# Patient Record
Sex: Female | Born: 1980
Health system: Southern US, Community
[De-identification: ages and names within clinical notes are randomized; demographics above are authoritative.]

## PROBLEM LIST (undated history)

## (undated) DIAGNOSIS — N390 Urinary tract infection, site not specified: Secondary | ICD-10-CM

## (undated) HISTORY — PX: APPENDECTOMY: SHX54

## (undated) HISTORY — PX: CHOLECYSTECTOMY: SHX55

## (undated) HISTORY — PX: ENDOMETRIAL ABLATION: SHX621

---

## 2005-01-18 ENCOUNTER — Emergency Department: Payer: Self-pay | Admitting: Emergency Medicine

## 2005-04-10 ENCOUNTER — Emergency Department: Payer: Self-pay | Admitting: Emergency Medicine

## 2006-08-24 ENCOUNTER — Emergency Department: Payer: Self-pay | Admitting: Emergency Medicine

## 2008-03-29 ENCOUNTER — Emergency Department: Payer: Self-pay | Admitting: Emergency Medicine

## 2008-10-03 ENCOUNTER — Emergency Department: Payer: Self-pay | Admitting: Internal Medicine

## 2009-09-12 ENCOUNTER — Observation Stay: Payer: Self-pay | Admitting: Psychiatry

## 2011-01-19 ENCOUNTER — Ambulatory Visit: Payer: Self-pay | Admitting: Unknown Physician Specialty

## 2011-01-23 ENCOUNTER — Ambulatory Visit: Payer: Self-pay | Admitting: Unknown Physician Specialty

## 2011-01-27 LAB — PATHOLOGY REPORT

## 2012-07-20 ENCOUNTER — Emergency Department: Payer: Self-pay | Admitting: Emergency Medicine

## 2013-03-09 ENCOUNTER — Emergency Department: Payer: Self-pay | Admitting: Emergency Medicine

## 2013-03-09 LAB — URINALYSIS, COMPLETE
Bilirubin,UR: NEGATIVE
Glucose,UR: NEGATIVE mg/dL (ref 0–75)
Ketone: NEGATIVE
Ph: 5 (ref 4.5–8.0)
Protein: 30
RBC,UR: 38 /HPF (ref 0–5)
Squamous Epithelial: 3
Transitional Epi: 1

## 2013-03-09 LAB — CBC
HCT: 42.7 % (ref 35.0–47.0)
HGB: 14.4 g/dL (ref 12.0–16.0)
MCV: 93 fL (ref 80–100)
Platelet: 347 10*3/uL (ref 150–440)

## 2013-03-09 LAB — COMPREHENSIVE METABOLIC PANEL
Albumin: 3.8 g/dL (ref 3.4–5.0)
Alkaline Phosphatase: 75 U/L (ref 50–136)
BUN: 14 mg/dL (ref 7–18)
Bilirubin,Total: 0.7 mg/dL (ref 0.2–1.0)
Co2: 26 mmol/L (ref 21–32)
Creatinine: 0.7 mg/dL (ref 0.60–1.30)
EGFR (Non-African Amer.): >60
Glucose: 83 mg/dL (ref 65–99)
Osmolality: 275 (ref 275–301)
Potassium: 4.1 mmol/L (ref 3.5–5.1)
SGOT(AST): 41 U/L — ABNORMAL HIGH (ref 15–37)
Sodium: 138 mmol/L (ref 136–145)

## 2013-05-26 ENCOUNTER — Emergency Department: Payer: Self-pay | Admitting: Emergency Medicine

## 2013-05-26 LAB — URINALYSIS, COMPLETE
Bilirubin,UR: NEGATIVE
Glucose,UR: NEGATIVE mg/dL (ref 0–75)
Nitrite: NEGATIVE
Ph: 5 (ref 4.5–8.0)
RBC,UR: 2375 /HPF (ref 0–5)
Specific Gravity: 1.028 (ref 1.003–1.030)
WBC UR: 5018 /HPF (ref 0–5)

## 2013-08-09 ENCOUNTER — Emergency Department: Payer: Self-pay | Admitting: Emergency Medicine

## 2013-08-09 LAB — URINALYSIS, COMPLETE
Specific Gravity: 1.021 (ref 1.003–1.030)
WBC UR: >30 /HPF (ref 0–5)

## 2013-08-11 LAB — URINE CULTURE

## 2013-10-09 ENCOUNTER — Emergency Department: Payer: Self-pay | Admitting: Emergency Medicine

## 2013-10-09 LAB — COMPREHENSIVE METABOLIC PANEL
Albumin: 3.5 g/dL (ref 3.4–5.0)
Alkaline Phosphatase: 76 U/L (ref 50–136)
Anion Gap: 10 (ref 7–16)
BUN: 16 mg/dL (ref 7–18)
Bilirubin,Total: 0.6 mg/dL (ref 0.2–1.0)
Chloride: 109 mmol/L — ABNORMAL HIGH (ref 98–107)
Co2: 19 mmol/L — ABNORMAL LOW (ref 21–32)
Creatinine: 0.86 mg/dL (ref 0.60–1.30)
EGFR (African American): >60
EGFR (Non-African Amer.): >60
Glucose: 125 mg/dL — ABNORMAL HIGH (ref 65–99)
Potassium: 3.9 mmol/L (ref 3.5–5.1)
SGOT(AST): 25 U/L (ref 15–37)
Sodium: 138 mmol/L (ref 136–145)
Total Protein: 8.2 g/dL (ref 6.4–8.2)

## 2013-10-09 LAB — CBC WITH DIFFERENTIAL/PLATELET
Basophil %: 1 %
Eosinophil #: 0.1 10*3/uL (ref 0.0–0.7)
Eosinophil %: 0.9 %
HCT: 43.3 % (ref 35.0–47.0)
HGB: 14.6 g/dL (ref 12.0–16.0)
Lymphocyte %: 20.7 %
Monocyte #: 0.6 x10 3/mm (ref 0.2–0.9)
Monocyte %: 4.4 %
Neutrophil #: 9.7 10*3/uL — ABNORMAL HIGH (ref 1.4–6.5)
RBC: 4.67 10*6/uL (ref 3.80–5.20)
WBC: 13.3 10*3/uL — ABNORMAL HIGH (ref 3.6–11.0)

## 2013-10-09 LAB — URINALYSIS, COMPLETE
Glucose,UR: NEGATIVE mg/dL (ref 0–75)
Ph: 6 (ref 4.5–8.0)
Protein: 100
RBC,UR: 264 /HPF (ref 0–5)
Specific Gravity: 1.017 (ref 1.003–1.030)
Squamous Epithelial: 4
WBC UR: 68 /HPF (ref 0–5)

## 2013-10-09 LAB — PREGNANCY, URINE: Pregnancy Test, Urine: NEGATIVE m[IU]/mL

## 2013-12-15 ENCOUNTER — Emergency Department: Payer: Self-pay | Admitting: Emergency Medicine

## 2014-02-01 ENCOUNTER — Encounter (HOSPITAL_COMMUNITY): Payer: Self-pay | Admitting: Emergency Medicine

## 2014-02-01 ENCOUNTER — Emergency Department (HOSPITAL_COMMUNITY): Payer: Worker's Compensation

## 2014-02-01 DIAGNOSIS — X503XXA Overexertion from repetitive movements, initial encounter: Secondary | ICD-10-CM | POA: Insufficient documentation

## 2014-02-01 DIAGNOSIS — Y9210 Unspecified residential institution as the place of occurrence of the external cause: Secondary | ICD-10-CM | POA: Insufficient documentation

## 2014-02-01 DIAGNOSIS — S2341XA Sprain of ribs, initial encounter: Secondary | ICD-10-CM | POA: Insufficient documentation

## 2014-02-01 DIAGNOSIS — F17200 Nicotine dependence, unspecified, uncomplicated: Secondary | ICD-10-CM | POA: Insufficient documentation

## 2014-02-01 DIAGNOSIS — Y9389 Activity, other specified: Secondary | ICD-10-CM | POA: Insufficient documentation

## 2014-02-01 DIAGNOSIS — Y990 Civilian activity done for income or pay: Secondary | ICD-10-CM | POA: Insufficient documentation

## 2014-02-01 LAB — COMPREHENSIVE METABOLIC PANEL
ALBUMIN: 3.9 g/dL (ref 3.5–5.2)
ALT: 40 U/L — ABNORMAL HIGH (ref 0–35)
AST: 26 U/L (ref 0–37)
Alkaline Phosphatase: 67 U/L (ref 39–117)
BUN: 12 mg/dL (ref 6–23)
CO2: 20 meq/L (ref 19–32)
CREATININE: 0.71 mg/dL (ref 0.50–1.10)
Calcium: 9 mg/dL (ref 8.4–10.5)
Chloride: 100 mEq/L (ref 96–112)
GLUCOSE: 95 mg/dL (ref 70–99)
POTASSIUM: 3.9 meq/L (ref 3.7–5.3)
Sodium: 138 mEq/L (ref 137–147)
Total Bilirubin: 0.8 mg/dL (ref 0.3–1.2)
Total Protein: 8.1 g/dL (ref 6.0–8.3)

## 2014-02-01 LAB — CBC WITH DIFFERENTIAL/PLATELET
BASOS PCT: 0 % (ref 0–1)
Basophils Absolute: 0 10*3/uL (ref 0.0–0.1)
Eosinophils Absolute: 0.1 10*3/uL (ref 0.0–0.7)
Eosinophils Relative: 1 % (ref 0–5)
HEMATOCRIT: 42.6 % (ref 36.0–46.0)
HEMOGLOBIN: 14.4 g/dL (ref 12.0–15.0)
LYMPHS ABS: 2.9 10*3/uL (ref 0.7–4.0)
LYMPHS PCT: 24 % (ref 12–46)
MCH: 31.7 pg (ref 26.0–34.0)
MCHC: 33.8 g/dL (ref 30.0–36.0)
MCV: 93.8 fL (ref 78.0–100.0)
MONO ABS: 0.5 10*3/uL (ref 0.1–1.0)
Monocytes Relative: 4 % (ref 3–12)
NEUTROS ABS: 8.7 10*3/uL — AB (ref 1.7–7.7)
NEUTROS PCT: 71 % (ref 43–77)
Platelets: 387 10*3/uL (ref 150–400)
RBC: 4.54 MIL/uL (ref 3.87–5.11)
RDW: 13.3 % (ref 11.5–15.5)
WBC: 12.2 10*3/uL — ABNORMAL HIGH (ref 4.0–10.5)

## 2014-02-01 LAB — I-STAT TROPONIN, ED: TROPONIN I, POC: 0 ng/mL (ref 0.00–0.08)

## 2014-02-01 NOTE — ED Notes (Signed)
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 Pt felt pain in her chest afterwards but when she got off in the morning the pain went away. Pt states that she was working last night and throughout the night she would try and move along with try and bend over and the pain got worse. Pt tried ibuprofen and it did not touch the pain.

## 2014-02-02 ENCOUNTER — Emergency Department (HOSPITAL_COMMUNITY)
Admission: EM | Admit: 2014-02-02 | Discharge: 2014-02-02 | Disposition: A | Discharge status: Home / Self Care | Payer: Worker's Compensation | Attending: Emergency Medicine | Admitting: Emergency Medicine

## 2014-02-02 DIAGNOSIS — S29011A Strain of muscle and tendon of front wall of thorax, initial encounter: Secondary | ICD-10-CM

## 2014-02-02 MED ORDER — METHOCARBAMOL 500 MG PO TABS
1000.0000 mg | ORAL_TABLET | ORAL | Status: AC
  Administered 2014-02-02: 1000 mg via ORAL
  Filled 2014-02-02: qty 2

## 2014-02-02 MED ORDER — METHOCARBAMOL 750 MG PO TABS: 750.0000 mg | ORAL_TABLET | Freq: Four times a day (QID) | ORAL | Status: DC

## 2014-02-02 MED ORDER — OXYCODONE-ACETAMINOPHEN 5-325 MG PO TABS: 2.0000 | ORAL_TABLET | ORAL | Status: DC | PRN

## 2014-02-02 MED ORDER — OXYCODONE-ACETAMINOPHEN 5-325 MG PO TABS
2.0000 | ORAL_TABLET | Freq: Once | ORAL | Status: AC
  Administered 2014-02-02: 2 via ORAL
  Filled 2014-02-02: qty 2

## 2014-02-02 NOTE — ED Notes (Signed)
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 Stated she felt chest pain, neck pain, and back pain while pulling up the pt. Pt reports severe chest wall muscle pain, neck pain, and back pain since Monday. States she has taken ibuprofen and iced the injured areas with non relief

## 2014-02-02 NOTE — Discharge Instructions (Signed)
Take medications as prescribed.  Follow up with your doctor or with your worker's compensation physician for further treatment and possible referral to physical therapy   Muscle Strain A muscle strain is an injury that occurs when a muscle is stretched beyond its normal length. Usually a small number of muscle fibers are torn when this happens. Muscle strain is rated in degrees. First-degree strains have the least amount of muscle fiber tearing and pain. Second-degree and third-degree strains have increasingly more tearing and pain.  Usually, recovery from muscle strain takes 1 2 weeks. Complete healing takes 5 6 weeks.  CAUSES  Muscle strain happens when a sudden, violent force placed on a muscle stretches it too far. This may occur with lifting, sports, or a fall.  RISK FACTORS Muscle strain is especially common in athletes.  SIGNS AND SYMPTOMS At the site of the muscle strain, there may be:  Pain.  Bruising.  Swelling.  Difficulty using the muscle due to pain or lack of normal function. DIAGNOSIS  Your health care provider will perform a physical exam and ask about your medical history. TREATMENT  Often, the best treatment for a muscle strain is resting, icing, and applying cold compresses to the injured area.  HOME CARE INSTRUCTIONS   Use the PRICE method of treatment to promote muscle healing during the first 2 3 days after your injury. The PRICE method involves:  Protecting the muscle from being injured again.  Restricting your activity and resting the injured body part.  Icing your injury. To do this, put ice in a plastic bag. Place a towel between your skin and the bag. Then, apply the ice and leave it on from 15 20 minutes each hour. After the third day, switch to moist heat packs.  Apply compression to the injured area with a splint or elastic bandage. Be careful not to wrap it too tightly. This may interfere with blood circulation or increase swelling.  Elevate the  injured body part above the level of your heart as often as you can.  Only take over-the-counter or prescription medicines for pain, discomfort, or fever as directed by your health care provider.  Warming up prior to exercise helps to prevent future muscle strains. SEEK MEDICAL CARE IF:   You have increasing pain or swelling in the injured area.  You have numbness, tingling, or a significant loss of strength in the injured area. MAKE SURE YOU:   Understand these instructions.  Will watch your condition.  Will get help right away if you are not doing well or get worse. Document Released: 11/09/2005 Document Revised: 08/30/2013 Document Reviewed: 06/08/2013 [facility] Patient Information 2014 [employer] [place].

## 2014-02-02 NOTE — ED Provider Notes (Signed)
CSN: [id number]     Arrival date & time 02/01/14  2127 History   First MD Initiated Contact with Patient 02/02/14 0119     Chief Complaint  Patient presents with  . Chest Pain     (Consider location/radiation/quality/duration/timing/severity/associated sxs/prior Treatment) HPI 33 yo female presents to the ER with complaint of right sided chest pain that radiates into shoulder and back.  Pt works at [employer].  On Sunday, 4 days ago she was helping lift an obese patient and had onset of pain.  Pain brief at that time, but since that time has become increasingly worse.  Pain with deep breathing, moving, palpation.  Pt has been using ice, ibuprofen without improvement.   History reviewed. No pertinent past medical history. Past Surgical History  Procedure Laterality Date  . Cesarean section    . Cholecystectomy    . Endometrial ablation     History reviewed. No pertinent family history. History  Substance Use Topics  . Smoking status: Current Every Day Smoker  . Smokeless tobacco: Not on file  . Alcohol Use: Yes   OB History   Grav Para Term Preterm Abortions TAB SAB Ect Mult Living                 Review of Systems  All other systems reviewed and are negative.      Allergies  Review of patient's allergies indicates no known allergies.  Home Medications   Current Outpatient Rx  Name  Route  Sig  Dispense  Refill  . ibuprofen (ADVIL,MOTRIN) 200 MG tablet   Oral   Take 1,000 mg by mouth every 6 (six) hours as needed for moderate pain.         Marland Kitchen. NASAL SALINE NA   Each Nare   Place 2 sprays into both nostrils 4 (four) times daily as needed (congestion).          BP 143/90  Pulse 116  Temp(Src) 97.8 F (36.6 C) (Oral)  Resp 18  Ht 5\' 5"  (1.651 m)  Wt 235 lb (106.595 kg)  BMI 39.11 kg/m2  SpO2 100% Physical Exam  Nursing note and vitals reviewed. Constitutional: She is oriented to person, place, and time. She appears well-developed and  well-nourished. She appears distressed.  HENT:  Head: Normocephalic and atraumatic.  Nose: Nose normal.  Mouth/Throat: Oropharynx is clear and moist.  Eyes: Conjunctivae and EOM are normal. Pupils are equal, round, and reactive to light.  Neck: Normal range of motion. Neck supple. No JVD present. No tracheal deviation present. No thyromegaly present.  Cardiovascular: Normal rate, regular rhythm, normal heart sounds and intact distal pulses.  Exam reveals no gallop and no friction rub.   No murmur heard. Pulmonary/Chest: Effort normal and breath sounds normal. No stridor. No respiratory distress. She has no wheezes. She has no rales. She exhibits tenderness (severe tenderness to right chest, sternum.  no crepitius, deformity noted).  Abdominal: Soft. Bowel sounds are normal. She exhibits no distension and no mass. There is no tenderness. There is no rebound and no guarding.  Musculoskeletal: She exhibits tenderness (severe tenderness over right upper chest, anteiror shoulder, right posterior scapula.  no crepitus or deformity.  Pain with ROM). She exhibits no edema.  Lymphadenopathy:    She has no cervical adenopathy.  Neurological: She is alert and oriented to person, place, and time. A cranial nerve deficit is present. She exhibits normal muscle tone. Coordination normal.  Skin: Skin is dry. No rash noted. No erythema.  No pallor.  Psychiatric: She has a normal mood and affect. Her behavior is normal. Judgment and thought content normal.    ED Course  Procedures (including critical care time) Labs Review Labs Reviewed  CBC WITH DIFFERENTIAL - Abnormal; Notable for the following:    WBC 12.2 (*)    Neutro Abs 8.7 (*)    All other components within normal limits  COMPREHENSIVE METABOLIC PANEL - Abnormal; Notable for the following:    ALT 40 (*)    All other components within normal limits  I-STAT TROPOININ, ED   Imaging Review Dg Chest 2 View  02/01/2014   CLINICAL DATA:  Right-sided  chest pain radiating to the back, shortness of breath.  EXAM: CHEST  2 VIEW  COMPARISON:  07/20/2012  FINDINGS: Cardiomediastinal contours within normal range. No confluent airspace opacity, pleural effusion, or pneumothorax. No acute osseous finding. Surgical clips right upper quadrant.  IMPRESSION: No radiographic evidence of an acute cardiopulmonary process.   Electronically Signed   By: Jearld Lesch M.D.   On: 02/01/2014 22:59     EKG Interpretation   Date/Time:  Thursday February 01 2014 21:32:57 EDT Ventricular Rate:  109 PR Interval:  134 QRS Duration: 80 QT Interval:  336 QTC Calculation: 452 R Axis:   96 Text Interpretation:  Sinus tachycardia Rightward axis Low voltage QRS  Borderline ECG No old tracing to compare Confirmed by Nakul Avino  MD, Destini Cambre  ([id number]) on 02/02/2014 1:20:47 AM      MDM   Final diagnoses:  Chest wall muscle strain    33 yo female with right sided chest wall pain after lifting patient.  After pain and muscle spasm medication, pt is feeling much better.  Labs, cxr, ekg wnl.  Do not feel sxs are due to PE    Olivia Mackie, MD 02/04/14 [phone]

## 2014-03-30 ENCOUNTER — Ambulatory Visit: Payer: Self-pay | Admitting: Physician Assistant

## 2014-05-17 ENCOUNTER — Ambulatory Visit: Payer: Self-pay

## 2014-05-17 LAB — CBC WITH DIFFERENTIAL/PLATELET
BASOS ABS: 0.1 10*3/uL (ref 0.0–0.1)
Basophil %: 1.3 %
Eosinophil #: 0.1 10*3/uL (ref 0.0–0.7)
Eosinophil %: 1.3 %
HCT: 42.8 % (ref 35.0–47.0)
HGB: 14.2 g/dL (ref 12.0–16.0)
LYMPHS ABS: 3.2 10*3/uL (ref 1.0–3.6)
Lymphocyte %: 29.4 %
MCH: 30.8 pg (ref 26.0–34.0)
MCHC: 33.1 g/dL (ref 32.0–36.0)
MCV: 93 fL (ref 80–100)
MONOS PCT: 3.4 %
Monocyte #: 0.4 x10 3/mm (ref 0.2–0.9)
NEUTROS ABS: 7 10*3/uL — AB (ref 1.4–6.5)
NEUTROS PCT: 64.6 %
Platelet: 407 10*3/uL (ref 150–440)
RBC: 4.61 10*6/uL (ref 3.80–5.20)
RDW: 12.7 % (ref 11.5–14.5)
WBC: 10.8 10*3/uL (ref 3.6–11.0)

## 2014-05-17 LAB — COMPREHENSIVE METABOLIC PANEL
ALBUMIN: 3.7 g/dL (ref 3.4–5.0)
ALK PHOS: 83 U/L
Anion Gap: 12 (ref 7–16)
BILIRUBIN TOTAL: 0.5 mg/dL (ref 0.2–1.0)
BUN: 15 mg/dL (ref 7–18)
CO2: 24 mmol/L (ref 21–32)
CREATININE: 0.88 mg/dL (ref 0.60–1.30)
Calcium, Total: 8.9 mg/dL (ref 8.5–10.1)
Chloride: 101 mmol/L (ref 98–107)
EGFR (African American): >60
GLUCOSE: 100 mg/dL — AB (ref 65–99)
Osmolality: 275 (ref 275–301)
POTASSIUM: 3.9 mmol/L (ref 3.5–5.1)
SGOT(AST): 29 U/L (ref 15–37)
SGPT (ALT): 46 U/L (ref 12–78)
Sodium: 137 mmol/L (ref 136–145)
Total Protein: 8.7 g/dL — ABNORMAL HIGH (ref 6.4–8.2)

## 2014-05-17 LAB — URINALYSIS, COMPLETE
BILIRUBIN, UR: NEGATIVE
BLOOD: NEGATIVE
GLUCOSE, UR: NEGATIVE mg/dL (ref 0–75)
KETONE: NEGATIVE
Leukocyte Esterase: NEGATIVE
Nitrite: NEGATIVE
PROTEIN: NEGATIVE
Ph: 6.5 (ref 4.5–8.0)
Specific Gravity: 1.02 (ref 1.003–1.030)

## 2014-05-17 LAB — LIPASE, BLOOD: LIPASE: 113 U/L (ref 73–393)

## 2014-05-17 LAB — GC/CHLAMYDIA PROBE AMP

## 2014-05-17 LAB — OCCULT BLOOD X 1 CARD TO LAB, STOOL: Occult Blood, Feces: NEGATIVE

## 2014-05-17 LAB — WET PREP, GENITAL

## 2014-05-18 LAB — URINE CULTURE

## 2014-07-09 ENCOUNTER — Ambulatory Visit: Payer: Self-pay | Admitting: Physician Assistant

## 2015-12-28 IMAGING — CR RIGHT GREAT TOE
3 series · 3 of 3 positions shown · non-contrast
Comparison: None.

CLINICAL DATA: Injured big toe.

EXAM:
RIGHT GREAT TOE

[toe ap]
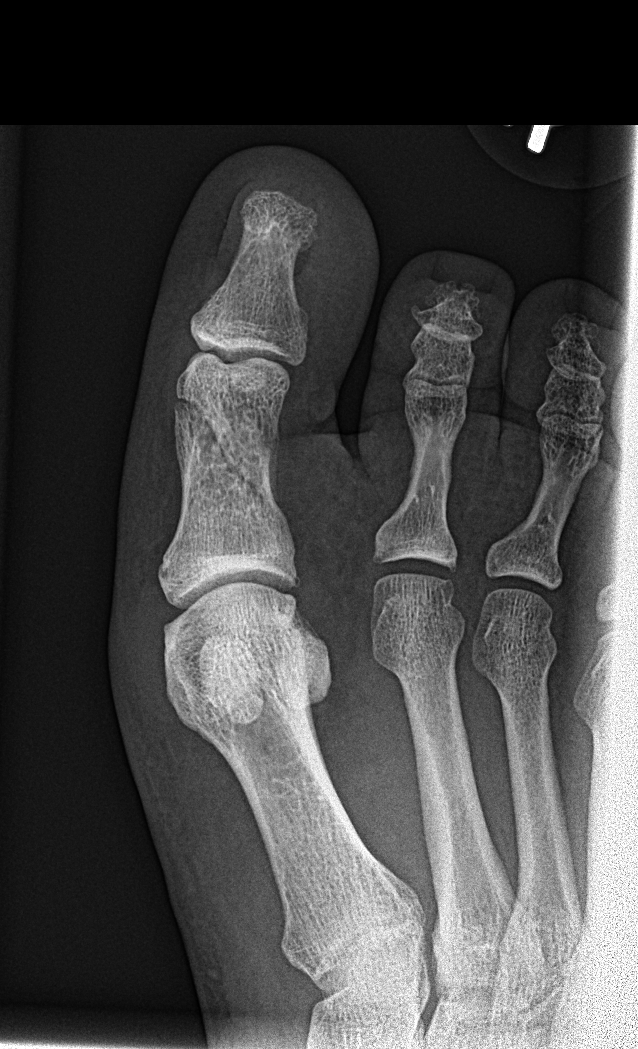

[toe obl]
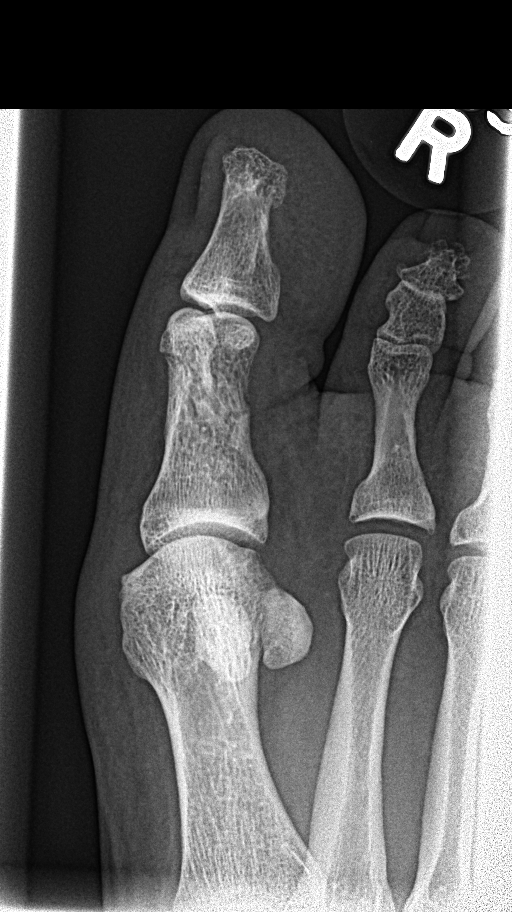

[toe lat]
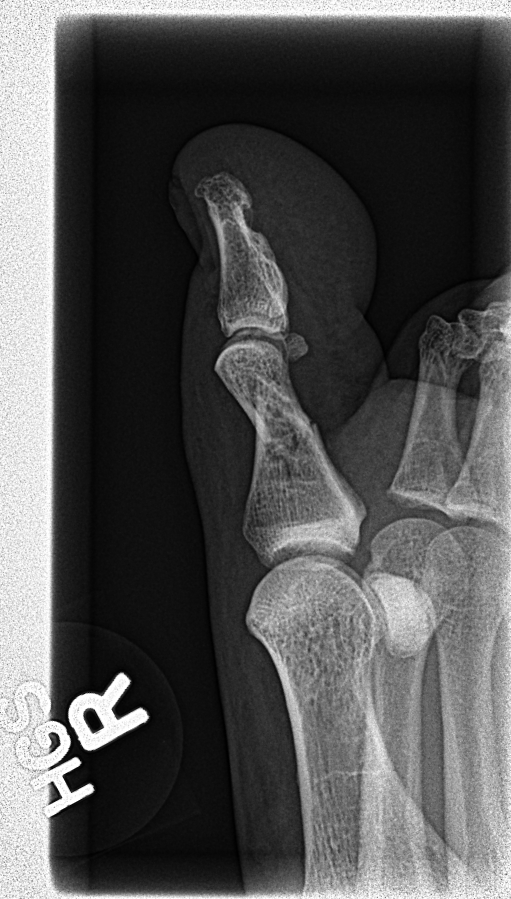

[3 of 3 positions shown; findings below may reference images not displayed]

FINDINGS: There is a nondisplaced oblique fracture involving the proximal
phalanx. No involvement of the articular surfaces.
IMPRESSION: Nondisplaced oblique fracture of the proximal phalanx.

## 2017-03-23 DIAGNOSIS — I878 Other specified disorders of veins: Secondary | ICD-10-CM | POA: Diagnosis not present

## 2017-03-23 DIAGNOSIS — M25572 Pain in left ankle and joints of left foot: Secondary | ICD-10-CM | POA: Diagnosis not present

## 2017-03-23 DIAGNOSIS — M79672 Pain in left foot: Secondary | ICD-10-CM | POA: Diagnosis not present

## 2017-03-23 DIAGNOSIS — M7731 Calcaneal spur, right foot: Secondary | ICD-10-CM | POA: Diagnosis not present

## 2017-03-23 DIAGNOSIS — R21 Rash and other nonspecific skin eruption: Secondary | ICD-10-CM | POA: Diagnosis not present

## 2017-03-23 DIAGNOSIS — M7732 Calcaneal spur, left foot: Secondary | ICD-10-CM | POA: Diagnosis not present

## 2017-03-23 DIAGNOSIS — M79605 Pain in left leg: Secondary | ICD-10-CM | POA: Diagnosis not present

## 2017-03-23 DIAGNOSIS — M7989 Other specified soft tissue disorders: Secondary | ICD-10-CM | POA: Diagnosis not present

## 2017-03-23 DIAGNOSIS — F17200 Nicotine dependence, unspecified, uncomplicated: Secondary | ICD-10-CM | POA: Diagnosis not present

## 2017-05-29 ENCOUNTER — Encounter: Payer: Self-pay | Admitting: Gynecology

## 2017-05-29 ENCOUNTER — Ambulatory Visit
Admission: EM | Admit: 2017-05-29 | Discharge: 2017-05-29 | Disposition: A | Discharge status: Home / Self Care | Payer: BLUE CROSS/BLUE SHIELD | Attending: Family Medicine | Admitting: Family Medicine

## 2017-05-29 DIAGNOSIS — H1031 Unspecified acute conjunctivitis, right eye: Secondary | ICD-10-CM

## 2017-05-29 DIAGNOSIS — S0501XA Injury of conjunctiva and corneal abrasion without foreign body, right eye, initial encounter: Secondary | ICD-10-CM | POA: Diagnosis not present

## 2017-05-29 MED ORDER — GENTAMICIN SULFATE 0.3 % OP SOLN: 2.0000 [drp] | OPHTHALMIC | 0 refills | Status: AC

## 2017-05-29 NOTE — ED Provider Notes (Signed)
CSN: [id number]     Arrival date & time 05/29/17  1406 History   First MD Initiated Contact with Patient 05/29/17 1430     Chief Complaint  Patient presents with  . Eye Problem   (Consider location/radiation/quality/duration/timing/severity/associated sxs/prior Treatment) 36 year old female presents with right eye irritation, redness, itchy and yellow discharge for the past 4 days. Also slightly sensitive to light and painful. Has tried OTC anti-redness drops with minimal relief. Wears contacts and still has left contact in. Took right contact out yesterday. They are daily contacts but she is unable to see without her contacts. Does not have glasses. Possibly exposed to pink eye at the pool or gym. Also having mild nasal congestion and sneezing that she has had for months. Takes Benadryl as needed. Has taken Ibuprofen today for pain/irritation with minimal relief. No other chronic health issues. Takes no daily medication.    The history is provided by the patient.    History reviewed. No pertinent past medical history. Past Surgical History:  Procedure Laterality Date  . CESAREAN SECTION    . CHOLECYSTECTOMY    . ENDOMETRIAL ABLATION     No family history on file. Social History  Substance Use Topics  . Smoking status: Current Every Day Smoker  . Smokeless tobacco: Never Used  . Alcohol use Yes   OB History    No data available     Review of Systems  Constitutional: Negative for appetite change, chills, fatigue and fever.  HENT: Positive for congestion, postnasal drip and sneezing. Negative for ear discharge, ear pain, mouth sores, nosebleeds, rhinorrhea, sinus pain, sinus pressure, sore throat and trouble swallowing.   Eyes: Positive for photophobia, pain, discharge, redness and itching. Negative for visual disturbance.  Respiratory: Negative for cough, chest tightness, shortness of breath and wheezing.   Cardiovascular: Negative for chest pain.  Gastrointestinal: Negative for  abdominal pain, diarrhea, nausea and vomiting.  Musculoskeletal: Negative for arthralgias, back pain, myalgias, neck pain and neck stiffness.  Skin: Negative for rash and wound.  Allergic/Immunologic: Positive for environmental allergies. Negative for immunocompromised state.  Neurological: Positive for headaches. Negative for dizziness, seizures, syncope, weakness, light-headedness and numbness.  Hematological: Negative for adenopathy.    Allergies  Patient has no known allergies.  Home Medications   Prior to Admission medications   Medication Sig Start Date End Date Taking? Authorizing Provider  ibuprofen (ADVIL,MOTRIN) 200 MG tablet Take 1,000 mg by mouth every 6 (six) hours as needed for moderate pain.   Yes [provider]  NASAL SALINE NA Place 2 sprays into both nostrils 4 (four) times daily as needed (congestion).   Yes [provider]  gentamicin (GARAMYCIN) 0.3 % ophthalmic solution Place 2 drops into the right eye every 4 (four) hours. 05/29/17 06/03/17  Sudie Grumbling, NP   Meds Ordered and Administered this Visit  Medications - No data to display  BP 125/77 (BP Location: Left Arm)   Pulse 88   Temp 98 F (36.7 C) (Oral)   Resp 16   Ht 5\' 5"  (1.651 m)   Wt 220 lb (99.8 kg)   SpO2 99%   BMI 36.61 kg/m  No data found.   Physical Exam  Constitutional: She is oriented to person, place, and time. She appears well-developed and well-nourished. No distress.  HENT:  Head: Normocephalic and atraumatic.  Right Ear: Hearing, tympanic membrane, external ear and ear canal normal.  Left Ear: Hearing, tympanic membrane, external ear and ear canal normal.  Nose: Rhinorrhea present. Right sinus exhibits no maxillary sinus tenderness and no frontal sinus tenderness. Left sinus exhibits no maxillary sinus tenderness and no frontal sinus tenderness.  Mouth/Throat: Uvula is midline, oropharynx is clear and moist and mucous membranes are normal.  Eyes: EOM are  normal. Pupils are equal, round, and reactive to light. Right eye exhibits discharge (slight yellow discharge present). Right eye exhibits no exudate and no hordeolum. No foreign body present in the right eye. Left eye exhibits no discharge, no exudate and no hordeolum. No foreign body present in the left eye. Right conjunctiva is injected.  Fundoscopic exam:      The right eye shows no hemorrhage and no papilledema. The right eye shows red reflex.       The left eye shows no hemorrhage and no papilledema. The left eye shows red reflex.  Slit lamp exam:      The right eye shows corneal abrasion. The right eye shows no foreign body.       The left eye shows no foreign body.    Right upper eyelid slightly swollen and red.   Right cornea with small abrasion around 2 to 5 o'clock just right of the iris. Another smaller abrasion present around 11 o'clock just left of iris. No foreign bodies seen.   Neck: Normal range of motion. Neck supple.  Cardiovascular: Normal rate, regular rhythm and normal heart sounds.   No murmur heard. Pulmonary/Chest: Effort normal and breath sounds normal. No respiratory distress. She has no wheezes.  Musculoskeletal: Normal range of motion.  Lymphadenopathy:    She has no cervical adenopathy.  Neurological: She is alert and oriented to person, place, and time.  Skin: Skin is warm and dry. No rash noted.  Psychiatric: She has a normal mood and affect. Her behavior is normal. Judgment and thought content normal.    Urgent Care Course     Procedures (including critical care time)  Labs Review Labs Reviewed - No data to display  Imaging Review No results found.   Visual Acuity Review  Right Eye Distance: unable to see (no corrective lens) Left Eye Distance: 20/40 (with corrective lens) Bilateral Distance: 20/40 (with left eye contact lens only)   Right Eye Near:   Left Eye Near:    Bilateral Near:         MDM   1. Acute bacterial conjunctivitis  of right eye   2. Abrasion of right cornea, initial encounter    Used Tetracaine drops to numb right eye. Used fluorescein stain and blacklight- presence of 2 small corneal abrasions seen on right eye.  Also discussed that she probably has bacterial pink eye and continuing to put her contacts in the past few days has probably caused a corneal abrasion. Recommend start Gentamicin 2 drops in right eye every 4 hours while awake. May use in left eye if it becomes infected. Recommend do not wear contacts in right eye for at least 48 hours- preferably 7 days to help with healing of corneal abrasion. Wash hands frequently. May take OTC allergy medication to help with nasal symptoms. Recommend contact her eye doctor (Dr. Senaida Oresichardson) on Monday (in 2 days) for follow-up and for Rx for glasses.     Sudie GrumblingAmyot, Vontae Court Berry, NP 05/29/17 1940

## 2017-05-29 NOTE — Discharge Instructions (Signed)
Recommend start Gentamicin antibiotic drops- place 2 drops in right eye every 4 hours while awake. May use for left eye if it becomes infected. Recommend do not wear contacts in right eye for at least 48 hours- preferably longer to help with healing of corneal abrasion. Contact your eye doctor (Dr. Senaida Oresichardson) on Monday for follow-up and for Rx for glasses.

## 2017-05-29 NOTE — ED Triage Notes (Signed)
Patient c/o right eye redness, drainage, itching x 4 days. Per patient had contacts in and took out right contact lens out this morning.

## 2017-09-26 DIAGNOSIS — Z9049 Acquired absence of other specified parts of digestive tract: Secondary | ICD-10-CM | POA: Diagnosis not present

## 2017-09-26 DIAGNOSIS — F17200 Nicotine dependence, unspecified, uncomplicated: Secondary | ICD-10-CM | POA: Diagnosis not present

## 2017-09-26 DIAGNOSIS — R220 Localized swelling, mass and lump, head: Secondary | ICD-10-CM | POA: Diagnosis not present

## 2017-09-26 DIAGNOSIS — R599 Enlarged lymph nodes, unspecified: Secondary | ICD-10-CM | POA: Diagnosis not present

## 2017-09-26 DIAGNOSIS — R590 Localized enlarged lymph nodes: Secondary | ICD-10-CM | POA: Diagnosis not present

## 2017-09-26 DIAGNOSIS — K0889 Other specified disorders of teeth and supporting structures: Secondary | ICD-10-CM | POA: Diagnosis not present

## 2017-09-26 DIAGNOSIS — Z6841 Body Mass Index (BMI) 40.0 and over, adult: Secondary | ICD-10-CM | POA: Diagnosis not present

## 2017-09-26 DIAGNOSIS — K047 Periapical abscess without sinus: Secondary | ICD-10-CM | POA: Diagnosis not present

## 2018-01-02 ENCOUNTER — Encounter: Payer: Self-pay | Admitting: Gynecology

## 2018-01-02 ENCOUNTER — Other Ambulatory Visit: Payer: Self-pay

## 2018-01-02 ENCOUNTER — Ambulatory Visit
Admission: EM | Admit: 2018-01-02 | Discharge: 2018-01-02 | Disposition: A | Discharge status: Home / Self Care | Payer: BLUE CROSS/BLUE SHIELD | Attending: Family Medicine | Admitting: Family Medicine

## 2018-01-02 DIAGNOSIS — J0100 Acute maxillary sinusitis, unspecified: Secondary | ICD-10-CM | POA: Diagnosis not present

## 2018-01-02 LAB — RAPID INFLUENZA A&B ANTIGENS
Influenza A (ARMC): NEGATIVE
Influenza B (ARMC): NEGATIVE

## 2018-01-02 LAB — RAPID STREP SCREEN (MED CTR MEBANE ONLY): STREPTOCOCCUS, GROUP A SCREEN (DIRECT): NEGATIVE

## 2018-01-02 MED ORDER — AMOXICILLIN 875 MG PO TABS: 875.0000 mg | ORAL_TABLET | Freq: Two times a day (BID) | ORAL | 0 refills | Status: DC

## 2018-01-02 NOTE — ED Triage Notes (Signed)
Patient c/o cough worse at night/ sore throat / hoarse and congestion.

## 2018-01-02 NOTE — ED Provider Notes (Signed)
[facility]    CSN: [id number] Arrival date & time: 01/02/18  1203     History   Chief Complaint Chief Complaint  Patient presents with  . Sore Throat  . Cough    HPI Jennifer Zamora is a 37 y.o. female.   The history is provided by the patient.  Sore Throat  Associated symptoms include headaches.  Cough  Associated symptoms: headaches, rhinorrhea and sore throat   Associated symptoms: no wheezing   URI  Presenting symptoms: congestion, cough, facial pain, fatigue, rhinorrhea and sore throat   Severity:  Moderate Onset quality:  Sudden Duration:  2 weeks Timing:  Constant Progression:  Unchanged Chronicity:  New Relieved by:  Nothing Ineffective treatments:  OTC medications Associated symptoms: headaches and sinus pain   Associated symptoms: no wheezing   Risk factors: sick contacts   Risk factors: not elderly, no chronic cardiac disease, no chronic kidney disease, no chronic respiratory disease, no diabetes mellitus, no immunosuppression, no recent illness and no recent travel     History reviewed. No pertinent past medical history.  There are no active problems to display for this patient.   Past Surgical History:  Procedure Laterality Date  . CESAREAN SECTION    . CHOLECYSTECTOMY    . ENDOMETRIAL ABLATION      OB History    No data available       Home Medications    Prior to Admission medications   Medication Sig Start Date End Date Taking? Authorizing Provider  NASAL SALINE NA Place 2 sprays into both nostrils 4 (four) times daily as needed (congestion).   Yes [provider]  amoxicillin (AMOXIL) 875 MG tablet Take 1 tablet (875 mg total) by mouth 2 (two) times daily. 01/02/18   Payton Mccallumonty, Jihan Rudy, MD  ibuprofen (ADVIL,MOTRIN) 200 MG tablet Take 1,000 mg by mouth every 6 (six) hours as needed for moderate pain.    [provider]    Family History Family History  Problem Relation Age of Onset  . Diabetes Father      Social History Social History   Tobacco Use  . Smoking status: Former Smoker    Last attempt to quit: 11/01/2017    Years since quitting: 0.1  . Smokeless tobacco: Never Used  Substance Use Topics  . Alcohol use: Yes  . Drug use: No     Allergies   Patient has no known allergies.   Review of Systems Review of Systems  Constitutional: Positive for fatigue.  HENT: Positive for congestion, rhinorrhea, sinus pain and sore throat.   Respiratory: Positive for cough. Negative for wheezing.   Neurological: Positive for headaches.     Physical Exam Triage Vital Signs ED Triage Vitals  Enc Vitals Group     BP 01/02/18 1225 (!) 138/94     Pulse Rate 01/02/18 1225 (!) 104     Resp 01/02/18 1225 16     Temp 01/02/18 1225 98.8 F (37.1 C)     Temp Source 01/02/18 1225 Oral     SpO2 01/02/18 1225 100 %     Weight 01/02/18 1225 210 lb (95.3 kg)     Height 01/02/18 1225 5\' 5"  (1.651 m)     Head Circumference --      Peak Flow --      Pain Score 01/02/18 1224 8     Pain Loc --      Pain Edu? --      Excl. in GC? --  No data found.  Updated Vital Signs BP (!) 138/94 (BP Location: Left Arm)   Pulse (!) 104   Temp 98.8 F (37.1 C) (Oral)   Resp 16   Ht 5\' 5"  (1.651 m)   Wt 210 lb (95.3 kg)   SpO2 100%   BMI 34.95 kg/m   Visual Acuity Right Eye Distance:   Left Eye Distance:   Bilateral Distance:    Right Eye Near:   Left Eye Near:    Bilateral Near:     Physical Exam  Constitutional: She appears well-developed and well-nourished. No distress.  HENT:  Head: Normocephalic and atraumatic.  Right Ear: Tympanic membrane, external ear and ear canal normal.  Left Ear: Tympanic membrane, external ear and ear canal normal.  Nose: Mucosal edema and rhinorrhea present. No nose lacerations, sinus tenderness, nasal deformity, septal deviation or nasal septal hematoma. No epistaxis.  No foreign bodies. Right sinus exhibits maxillary sinus tenderness and frontal sinus  tenderness. Left sinus exhibits maxillary sinus tenderness and frontal sinus tenderness.  Mouth/Throat: Uvula is midline, oropharynx is clear and moist and mucous membranes are normal. No oropharyngeal exudate.  Eyes: Conjunctivae and EOM are normal. Pupils are equal, round, and reactive to light. Right eye exhibits no discharge. Left eye exhibits no discharge. No scleral icterus.  Neck: Normal range of motion. Neck supple. No thyromegaly present.  Cardiovascular: Normal rate, regular rhythm and normal heart sounds.  Pulmonary/Chest: Effort normal and breath sounds normal. No respiratory distress. She has no wheezes. She has no rales.  Lymphadenopathy:    She has no cervical adenopathy.  Skin: She is not diaphoretic.  Nursing note and vitals reviewed.    UC Treatments / Results  Labs (all labs ordered are listed, but only abnormal results are displayed) Labs Reviewed  RAPID STREP SCREEN (NOT AT [facility])  RAPID INFLUENZA A&B ANTIGENS ([facility] ONLY)  CULTURE, GROUP A STREP [facility])    EKG  EKG Interpretation None       Radiology No results found.  Procedures Procedures (including critical care time)  Medications Ordered in UC Medications - No data to display   Initial Impression / Assessment and Plan / UC Course  I have reviewed the triage vital signs and the nursing notes.  Pertinent labs & imaging results that were available during my care of the patient were reviewed by me and considered in my medical decision making (see chart for details).       Final Clinical Impressions(s) / UC Diagnoses   Final diagnoses:  Acute maxillary sinusitis, recurrence not specified    ED Discharge Orders        Ordered    amoxicillin (AMOXIL) 875 MG tablet  2 times daily     01/02/18 1305     1. diagnosis reviewed with patient 2. rx as per orders above; reviewed possible side effects, interactions, risks and benefits  3. Recommend supportive treatment with fluids, rest, otc  flonase, otc sudafed 4. Follow-up prn if symptoms worsen or don't improve  Controlled Substance Prescriptions [place] Controlled Substance Registry consulted? Not Applicable   Payton Mccallum, MD 01/02/18 [phone]

## 2018-01-05 ENCOUNTER — Telehealth: Payer: Self-pay | Admitting: Emergency Medicine

## 2018-01-05 LAB — CULTURE, GROUP A STREP (THRC)

## 2018-01-05 NOTE — Telephone Encounter (Signed)
Called patient to follow-up with patient regarding recent visit at [facility].  Patient instructed to call back with any questions or concerns. H. Jaeceon Michelin RN 

## 2018-03-27 ENCOUNTER — Other Ambulatory Visit: Payer: Self-pay

## 2018-03-27 ENCOUNTER — Encounter: Payer: Self-pay | Admitting: Emergency Medicine

## 2018-03-27 ENCOUNTER — Ambulatory Visit
Admission: EM | Admit: 2018-03-27 | Discharge: 2018-03-27 | Disposition: A | Discharge status: Home / Self Care | Payer: BLUE CROSS/BLUE SHIELD | Attending: Family Medicine | Admitting: Family Medicine

## 2018-03-27 DIAGNOSIS — R059 Cough, unspecified: Secondary | ICD-10-CM

## 2018-03-27 DIAGNOSIS — J0110 Acute frontal sinusitis, unspecified: Secondary | ICD-10-CM | POA: Diagnosis not present

## 2018-03-27 DIAGNOSIS — R05 Cough: Secondary | ICD-10-CM

## 2018-03-27 MED ORDER — DOXYCYCLINE HYCLATE 100 MG PO CAPS: 100.0000 mg | ORAL_CAPSULE | Freq: Two times a day (BID) | ORAL | 0 refills | Status: DC

## 2018-03-27 MED ORDER — BENZONATATE 100 MG PO CAPS: 100.0000 mg | ORAL_CAPSULE | Freq: Three times a day (TID) | ORAL | 0 refills | Status: DC | PRN

## 2018-03-27 NOTE — ED Triage Notes (Signed)
Patient c/o cough and chest congestion for 7 days.  Patient denies fevers. 

## 2018-03-27 NOTE — ED Provider Notes (Signed)
[facility] ____________________________________________  Time seen: Approximately 4:42 PM  I have reviewed the triage vital signs and the nursing notes.   HISTORY  Chief Complaint Sinus Problem   HPI Jennifer Zamora is a 37 y.o. female presenting for evaluation of 1 week of runny nose, nasal congestion, sinus pressure, postnasal drainage and cough.  Reports cough is worse at night with associated postnasal drainage.  Reports getting a lot of very thick sometimes greenish nasal drainage.  Also reporting some left ear discomfort.  States has been taking over-the-counter Mucinex with slight improvement but no resolution.  States having a lot of sinus pressure to her forehead and cheeks.  Denies known fevers.  Reports she feels like she is having some potential triggers from allergies.  Overall continues to eat and drink well.  Reports otherwise feels well. Denies chest pain, shortness of breath, abdominal pain, extremity swelling or rash. Denies recent sickness. Denies recent antibiotic use.   No LMP recorded. Patient has had an ablation.   History reviewed. No pertinent past medical history.  There are no active problems to display for this patient.   Past Surgical History:  Procedure Laterality Date  . CESAREAN SECTION    . CHOLECYSTECTOMY    . ENDOMETRIAL ABLATION       No current facility-administered medications for this encounter.   Current Outpatient Medications:  .  benzonatate (TESSALON PERLES) 100 MG capsule, Take 1 capsule (100 mg total) by mouth 3 (three) times daily as needed for cough., Disp: 15 capsule, Rfl: 0 .  doxycycline (VIBRAMYCIN) 100 MG capsule, Take 1 capsule (100 mg total) by mouth 2 (two) times daily., Disp: 20 capsule, Rfl: 0 .  ibuprofen (ADVIL,MOTRIN) 200 MG tablet, Take 1,000 mg by mouth every 6 (six) hours as needed for moderate pain., Disp: , Rfl:   Allergies Patient has no known allergies.  Family History  Problem Relation Age of  Onset  . Diabetes Father     Social History Social History   Tobacco Use  . Smoking status: Former Smoker    Last attempt to quit: 11/01/2017    Years since quitting: 0.4  . Smokeless tobacco: Never Used  Substance Use Topics  . Alcohol use: Yes  . Drug use: No    Review of Systems Constitutional: No fever/chills ENT: States occasional intermittent sore throat, not currently Cardiovascular: Denies chest pain. Respiratory: Denies shortness of breath. Gastrointestinal: No abdominal pain.   Genitourinary: Negative for dysuria. Musculoskeletal: Negative for back pain. Skin: Negative for rash.   ____________________________________________   PHYSICAL EXAM:  VITAL SIGNS: ED Triage Vitals  Enc Vitals Group     BP 03/27/18 1530 (!) 131/99     Pulse Rate 03/27/18 1530 (!) 103     Resp 03/27/18 1530 16     Temp 03/27/18 1530 98.1 F (36.7 C)     Temp Source 03/27/18 1530 Oral     SpO2 03/27/18 1530 98 %     Weight 03/27/18 1528 220 lb (99.8 kg)     Height 03/27/18 1528  (1.651 m)     Head Circumference --      Peak Flow --      Pain Score 03/27/18 1528 8     Pain Loc --      Pain Edu? --      Excl. in GC? --     Constitutional: Alert and oriented. Well appearing and in no acute distress. Eyes: Conjunctivae are normal. PERRL.  Head: Atraumatic.Mild  to moderate tenderness to palpation bilateral frontal and mild tenderness bilateral maxillary sinuses. No swelling. No erythema.   Ears: Left: Nontender, normal canal, no erythema, mild effusion present, otherwise normal TM.  Right: Nontender, normal canal, no erythema, normal TM.  No mastoid tenderness bilaterally.  Nose: nasal congestion with bilateral nasal turbinate erythema and edema.   Mouth/Throat: Mucous membranes are moist.  Oropharynx non-erythematous.No tonsillar swelling or exudate.  Neck: No stridor.  No cervical spine tenderness to palpation. Hematological/Lymphatic/Immunilogical: No cervical  lymphadenopathy. Cardiovascular: Normal rate, regular rhythm. Grossly normal heart sounds.  Good peripheral circulation. Respiratory: Normal respiratory effort.  No retractions.No wheezes, rales or rhonchi. Good air movement.  Musculoskeletal:  No cervical, thoracic or lumbar tenderness to palpation.  Neurologic:  Normal speech and language. No gross focal neurologic deficits are appreciated. No gait instability. Skin:  Skin is warm, dry and intact. No rash noted. Psychiatric: Mood and affect are normal. Speech and behavior are normal.  ___________________________________________   LABS (all labs ordered are listed, but only abnormal results are displayed)  Labs Reviewed - No data to display ____________________________________________   PROCEDURES Procedures   INITIAL IMPRESSION / ASSESSMENT AND PLAN / ED COURSE  Pertinent labs & imaging results that were available during my care of the patient were reviewed by me and considered in my medical decision making (see chart for details).  We will bring patient.  No acute distress.  Suspect allergic rhinitis with secondary frontal sinusitis.  Will treat with oral doxycycline.  Encourage rest, fluids, supportive care, over-the-counter decongestants and antihistamines.  Also PRN Tessalon Perles.  Follow-up with ENT if continues to be recurrent.Discussed indication, risks and benefits of medications with patient.  Discussed follow up with Primary care physician this week. Discussed follow up and return parameters including no resolution or any worsening concerns. Patient verbalized understanding and agreed to plan.   ____________________________________________   FINAL CLINICAL IMPRESSION(S) / ED DIAGNOSES  Final diagnoses:  Acute frontal sinusitis, recurrence not specified  Cough     ED Discharge Orders        Ordered    doxycycline (VIBRAMYCIN) 100 MG capsule  2 times daily     03/27/18 1649    benzonatate (TESSALON PERLES) 100  MG capsule  3 times daily PRN     03/27/18 1649       Note: This dictation was prepared with Dragon dictation along with smaller phrase technology. Any transcriptional errors that result from this process are unintentional.         Renford Dills, NP 03/27/18 1736

## 2018-03-27 NOTE — Discharge Instructions (Signed)
Take medication as prescribed. Rest. Drink plenty of fluids.  ° °Follow up with your primary care physician this week as needed. Return to Urgent care for new or worsening concerns.  ° °

## 2018-06-12 ENCOUNTER — Other Ambulatory Visit: Payer: Self-pay

## 2018-06-12 ENCOUNTER — Encounter: Payer: Self-pay | Admitting: Emergency Medicine

## 2018-06-12 ENCOUNTER — Ambulatory Visit
Admission: EM | Admit: 2018-06-12 | Discharge: 2018-06-12 | Disposition: A | Discharge status: Home / Self Care | Payer: BLUE CROSS/BLUE SHIELD | Attending: Family Medicine | Admitting: Family Medicine

## 2018-06-12 DIAGNOSIS — R300 Dysuria: Secondary | ICD-10-CM | POA: Diagnosis not present

## 2018-06-12 DIAGNOSIS — R350 Frequency of micturition: Secondary | ICD-10-CM

## 2018-06-12 DIAGNOSIS — N3001 Acute cystitis with hematuria: Secondary | ICD-10-CM

## 2018-06-12 LAB — URINALYSIS, COMPLETE (UACMP) WITH MICROSCOPIC
Bilirubin Urine: NEGATIVE
Glucose, UA: NEGATIVE mg/dL
Ketones, ur: NEGATIVE mg/dL
NITRITE: POSITIVE — AB
Protein, ur: >300 mg/dL — AB
RBC / HPF: >50 RBC/hpf (ref 0–5)
WBC, UA: >50 WBC/hpf (ref 0–5)
pH: 6 (ref 5.0–8.0)

## 2018-06-12 MED ORDER — FLUCONAZOLE 150 MG PO TABS: 150.0000 mg | ORAL_TABLET | Freq: Once | ORAL | 0 refills | Status: AC

## 2018-06-12 MED ORDER — NITROFURANTOIN MONOHYD MACRO 100 MG PO CAPS: 100.0000 mg | ORAL_CAPSULE | Freq: Two times a day (BID) | ORAL | 0 refills | Status: DC

## 2018-06-12 NOTE — ED Provider Notes (Signed)
[facility]   CSN: [id number] Arrival date & time: 06/12/18  0804  History   Chief Complaint Chief Complaint  Patient presents with  . Dysuria    HPI  37 year old female presents with concerns for UTI.  Patient states that she has had dysuria, urinary frequency that started yesterday.  Moderate in severity. No fevers or chills.  No abdominal pain.  No flank pain.  No medications or interventions tried.  No other associated symptoms.  No other complaints.  Social History Social History   Tobacco Use  . Smoking status: Former Smoker    Last attempt to quit: 11/01/2017    Years since quitting: 0.6  . Smokeless tobacco: Never Used  Substance Use Topics  . Alcohol use: Yes  . Drug use: No   Allergies   Patient has no known allergies.   Review of Systems Review of Systems  Constitutional: Negative.   Gastrointestinal: Negative.   Genitourinary: Positive for dysuria and frequency.   Physical Exam Triage Vital Signs ED Triage Vitals  Enc Vitals Group     BP 06/12/18 0818 (!) 137/105     Pulse Rate 06/12/18 0818 95     Resp 06/12/18 0818 16     Temp 06/12/18 0818 98.1 F (36.7 C)     Temp Source 06/12/18 0818 Oral     SpO2 06/12/18 0818 99 %     Weight 06/12/18 0816 220 lb (99.8 kg)     Height 06/12/18 0816 5\' 5"  (1.651 m)     Head Circumference --      Peak Flow --      Pain Score 06/12/18 0811 9     Pain Loc --      Pain Edu? --      Excl. in GC? --    Updated Vital Signs BP (!) 137/105 (BP Location: Left Arm)   Pulse 95   Temp 98.1 F (36.7 C) (Oral)   Resp 16   Ht 5\' 5"  (1.651 m)   Wt 220 lb (99.8 kg)   SpO2 99%   BMI 36.61 kg/m   Visual Acuity Right Eye Distance:   Left Eye Distance:   Bilateral Distance:    Right Eye Near:   Left Eye Near:    Bilateral Near:     Physical Exam  Constitutional: She is oriented to person, place, and time. She appears well-developed. No distress.  Cardiovascular: Normal rate and regular rhythm.    Pulmonary/Chest: Effort normal and breath sounds normal. She has no wheezes. She has no rales.  Abdominal: Soft. She exhibits no distension. There is no tenderness.  Neurological: She is alert and oriented to person, place, and time.  Psychiatric: She has a normal mood and affect. Her behavior is normal.  Nursing note and vitals reviewed.  UC Treatments / Results  Labs (all labs ordered are listed, but only abnormal results are displayed) Labs Reviewed  URINALYSIS, COMPLETE (UACMP) WITH MICROSCOPIC - Abnormal; Notable for the following components:      Result Value   APPearance CLOUDY (*)    Specific Gravity, Urine >1.030 (*)    Hgb urine dipstick LARGE (*)    Protein, ur >300 (*)    Nitrite POSITIVE (*)    Leukocytes, UA MODERATE (*)    Bacteria, UA MANY (*)    All other components within normal limits  URINE CULTURE    EKG None  Radiology No results found.  Procedures Procedures (including critical care time)  Medications Ordered  in UC Medications - No data to display  Initial Impression / Assessment and Plan / UC Course  I have reviewed the triage vital signs and the nursing notes.  Pertinent labs & imaging results that were available during my care of the patient were reviewed by me and considered in my medical decision making (see chart for details).    37 year old female presents with UTI.  Sending culture.  Placing on Macrobid.  Diflucan if needed.  Final Clinical Impressions(s) / UC Diagnoses   Final diagnoses:  Acute cystitis with hematuria   Discharge Instructions   None    ED Prescriptions    Medication Sig Dispense Auth. Provider   nitrofurantoin, macrocrystal-monohydrate, (MACROBID) 100 MG capsule Take 1 capsule (100 mg total) by mouth 2 (two) times daily. 14 capsule Andres Vest G, DO   fluconazole (DIFLUCAN) 150 MG tablet Take 1 tablet (150 mg total) by mouth once for 1 dose. Repeat dose in 72 hours. 2 tablet Tommie Sams, DO     Controlled  Substance Prescriptions [place] Controlled Substance Registry consulted? Not Applicable   Tommie Sams, [place] 06/12/18 2130

## 2018-06-12 NOTE — ED Triage Notes (Signed)
Patient c/o dysuria and urinary frequency that started yesterday.  

## 2018-06-15 LAB — URINE CULTURE: Culture: >=100000 — AB

## 2018-07-03 ENCOUNTER — Other Ambulatory Visit: Payer: Self-pay

## 2018-07-03 ENCOUNTER — Ambulatory Visit
Admission: EM | Admit: 2018-07-03 | Discharge: 2018-07-03 | Disposition: A | Discharge status: Home / Self Care | Payer: BLUE CROSS/BLUE SHIELD | Attending: Family Medicine | Admitting: Family Medicine

## 2018-07-03 DIAGNOSIS — B373 Candidiasis of vulva and vagina: Secondary | ICD-10-CM | POA: Diagnosis not present

## 2018-07-03 DIAGNOSIS — B3731 Acute candidiasis of vulva and vagina: Secondary | ICD-10-CM

## 2018-07-03 DIAGNOSIS — R300 Dysuria: Secondary | ICD-10-CM | POA: Diagnosis not present

## 2018-07-03 DIAGNOSIS — N898 Other specified noninflammatory disorders of vagina: Secondary | ICD-10-CM

## 2018-07-03 DIAGNOSIS — G44209 Tension-type headache, unspecified, not intractable: Secondary | ICD-10-CM | POA: Diagnosis not present

## 2018-07-03 DIAGNOSIS — R350 Frequency of micturition: Secondary | ICD-10-CM | POA: Diagnosis not present

## 2018-07-03 LAB — URINALYSIS, COMPLETE (UACMP) WITH MICROSCOPIC

## 2018-07-03 LAB — WET PREP, GENITAL
CLUE CELLS WET PREP: NONE SEEN
SPERM: NONE SEEN
Trich, Wet Prep: NONE SEEN
WBC WET PREP: NONE SEEN

## 2018-07-03 MED ORDER — CEPHALEXIN 500 MG PO CAPS: 500.0000 mg | ORAL_CAPSULE | Freq: Two times a day (BID) | ORAL | 0 refills | Status: AC

## 2018-07-03 MED ORDER — FLUCONAZOLE 150 MG PO TABS: 150.0000 mg | ORAL_TABLET | Freq: Every day | ORAL | 0 refills | Status: DC

## 2018-07-03 NOTE — ED Triage Notes (Signed)
Pt reports just finishing Macrobid for UTI end of July. Felt better but sx returned 4 days ago. Burning with urination, frequency, and headache.

## 2018-07-03 NOTE — Discharge Instructions (Addendum)
Take medication as prescribed. Rest. Drink plenty of fluids.  ° °Follow up with your primary care physician this week as needed. Return to Urgent care for new or worsening concerns.  ° °

## 2018-07-03 NOTE — ED Provider Notes (Signed)
[facility] ____________________________________________  Time seen: Approximately 12:04 PM  I have reviewed the triage vital signs and the nursing notes.   HISTORY  Chief Complaint Dysuria  HPI Jennifer Zamora is a 37 y.o. female presenting for evaluation of urinary frequency and burning urination present for the last 4 days.  Patient reports that she was seen and treated for UTI at the end of July which culture returned positive for E. coli.  States that she was treated with Macrobid and symptoms did resolve, but has since returned.  States symptoms consistent with previous UTIs.  Also reports having some vaginal discharge and irritation.  Denies concerns of STDs.  Sexually active with same partner, denies concerns of pregnancy.  Denies abdominal pain, atypical back pain, fevers, vomiting or diarrhea.  Reports continues to eat and drink well.  States last UTI she felt was triggered by drinking a caffeinated soda, unsure of triggers currently.  Does report she has been having intermittent headaches, reports that this is something that she often has.  States headaches resolved with ibuprofen and rest.  Denies vision changes, unsteady gait, confusion, paresthesias or abrupt onset.  Reports otherwise feels well.  No LMP recorded. Patient has had an ablation.   History reviewed. No pertinent past medical history.  There are no active problems to display for this patient.   Past Surgical History:  Procedure Laterality Date  . CESAREAN SECTION    . CHOLECYSTECTOMY    . ENDOMETRIAL ABLATION       No current facility-administered medications for this encounter.   Current Outpatient Medications:  .  cephALEXin (KEFLEX) 500 MG capsule, Take 1 capsule (500 mg total) by mouth 2 (two) times daily for 7 days., Disp: 14 capsule, Rfl: 0 .  fluconazole (DIFLUCAN) 150 MG tablet, Take 1 tablet (150 mg total) by mouth daily. Take one pill orally, then Repeat at end of antibiotic, Disp: 2  tablet, Rfl: 0 .  ibuprofen (ADVIL,MOTRIN) 200 MG tablet, Take 1,000 mg by mouth every 6 (six) hours as needed for moderate pain., Disp: , Rfl:   Allergies Patient has no known allergies.  Family History  Problem Relation Age of Onset  . Diabetes Father     Social History Social History   Tobacco Use  . Smoking status: Former Smoker    Last attempt to quit: 11/01/2017    Years since quitting: 0.6  . Smokeless tobacco: Never Used  Substance Use Topics  . Alcohol use: Yes    Comment: social  . Drug use: No    Review of Systems Constitutional: No fever/chills Eyes: No visual changes. ENT: No sore throat. Cardiovascular: Denies chest pain. Respiratory: Denies shortness of breath. Gastrointestinal: No abdominal pain.  No nausea, no vomiting.  No diarrhea.  No constipation. Genitourinary: positive for dysuria. Musculoskeletal: Negative for back pain. Skin: Negative for rash. Neurological: Negative for focal weakness or numbness. Intermittent headaches as above.   ____________________________________________   PHYSICAL EXAM:  VITAL SIGNS: ED Triage Vitals  Enc Vitals Group     BP 07/03/18 1110 (!) 141/94     Pulse Rate 07/03/18 1110 90     Resp 07/03/18 1110 18     Temp 07/03/18 1110 98.2 F (36.8 C)     Temp src --      SpO2 07/03/18 1110 96 %     Weight 07/03/18 1112 220 lb 0.3 oz (99.8 kg)     Height 07/03/18 1112 5\' 3"  (1.6 m)  Head Circumference --      Peak Flow --      Pain Score 07/03/18 1112 5     Pain Loc --      Pain Edu? --      Excl. in GC? --     Constitutional: Alert and oriented. Well appearing and in no acute distress. Eyes: Conjunctivae are normal. PERRL. EOMI. ENT      Head: Normocephalic and atraumatic. Cardiovascular: Normal rate, regular rhythm. Grossly normal heart sounds.  Good peripheral circulation. Respiratory: Normal respiratory effort without tachypnea nor retractions. Breath sounds are clear and equal bilaterally. No  wheezes, rales, rhonchi. Gastrointestinal: Soft and nontender.  No CVA tenderness. Musculoskeletal:   No midline cervical, thoracic or lumbar tenderness to palpation.  Neurologic:  Normal speech and language. No gross focal neurologic deficits are appreciated. Speech is normal. No gait instability. No paresthesias. Skin:  Skin is warm, dry and intact. No rash noted. Psychiatric: Mood and affect are normal. Speech and behavior are normal. Patient exhibits appropriate insight and judgment   ___________________________________________   LABS (all labs ordered are listed, but only abnormal results are displayed)  Labs Reviewed  WET PREP, GENITAL - Abnormal; Notable for the following components:      Result Value   Yeast Wet Prep HPF POC PRESENT (*)    All other components within normal limits  URINALYSIS, COMPLETE (UACMP) WITH MICROSCOPIC - Abnormal; Notable for the following components:   Color, Urine ORANGE (*)    APPearance HAZY (*)    Glucose, UA   (*)    Value: TEST NOT REPORTED DUE TO COLOR INTERFERENCE OF URINE PIGMENT   Hgb urine dipstick   (*)    Value: TEST NOT REPORTED DUE TO COLOR INTERFERENCE OF URINE PIGMENT   Bilirubin Urine   (*)    Value: TEST NOT REPORTED DUE TO COLOR INTERFERENCE OF URINE PIGMENT   Ketones, ur   (*)    Value: TEST NOT REPORTED DUE TO COLOR INTERFERENCE OF URINE PIGMENT   Protein, ur   (*)    Value: TEST NOT REPORTED DUE TO COLOR INTERFERENCE OF URINE PIGMENT   Nitrite   (*)    Value: TEST NOT REPORTED DUE TO COLOR INTERFERENCE OF URINE PIGMENT   Leukocytes, UA   (*)    Value: TEST NOT REPORTED DUE TO COLOR INTERFERENCE OF URINE PIGMENT   Bacteria, UA RARE (*)    All other components within normal limits  URINE CULTURE    PROCEDURES Procedures   INITIAL IMPRESSION / ASSESSMENT AND PLAN / ED COURSE  Pertinent labs & imaging results that were available during my care of the patient were reviewed by me and considered in my medical decision  making (see chart for details).  Well appearing patient, no acute distress. Urine reviewed, discussed not clear uti, will culture and start on oral Keflex and await urine culture.  Discussed with patient and evaluation of wet prep.  Patient elected self wet prep, wet prep positive for yeast.  Will treat with Diflucan now and again end of antibiotics.  Also suspect intermittent tension headaches.  Encouraged continued over-the-counter Tylenol ibuprofen and supportive care. Encourage rest, fluids, supportive care.Discussed indication, risks and benefits of medications with patient.  Discussed follow up with Primary care physician this week. Discussed follow up and return parameters including no resolution or any worsening concerns. Patient verbalized understanding and agreed to plan.   ____________________________________________   FINAL CLINICAL IMPRESSION(S) / ED DIAGNOSES  Final diagnoses:  Dysuria  Yeast vaginitis  Tension headache     ED Discharge Orders         Ordered    cephALEXin (KEFLEX) 500 MG capsule  2 times daily     07/03/18 1153    fluconazole (DIFLUCAN) 150 MG tablet  Daily     07/03/18 1153           Note: This dictation was prepared with Dragon dictation along with smaller phrase technology. Any transcriptional errors that result from this process are unintentional.         Renford Dills, NP 07/03/18 1212

## 2018-07-06 LAB — URINE CULTURE: Culture: >=100000 — AB

## 2018-07-07 ENCOUNTER — Telehealth (HOSPITAL_COMMUNITY): Payer: Self-pay

## 2018-07-07 NOTE — Telephone Encounter (Signed)
Urine culture positive for E.coli. Pt was treated with Keflex at visit. Pt called and made aware.

## 2018-07-17 DIAGNOSIS — M2240 Chondromalacia patellae, unspecified knee: Secondary | ICD-10-CM | POA: Diagnosis not present

## 2018-07-17 DIAGNOSIS — M222X2 Patellofemoral disorders, left knee: Secondary | ICD-10-CM | POA: Diagnosis not present

## 2018-07-17 DIAGNOSIS — M25562 Pain in left knee: Secondary | ICD-10-CM | POA: Diagnosis not present

## 2018-09-14 ENCOUNTER — Encounter: Payer: Self-pay | Admitting: Emergency Medicine

## 2018-09-14 ENCOUNTER — Other Ambulatory Visit: Payer: Self-pay

## 2018-09-14 ENCOUNTER — Ambulatory Visit
Admission: EM | Admit: 2018-09-14 | Discharge: 2018-09-14 | Disposition: A | Discharge status: Home / Self Care | Payer: BLUE CROSS/BLUE SHIELD | Attending: Emergency Medicine | Admitting: Emergency Medicine

## 2018-09-14 DIAGNOSIS — R319 Hematuria, unspecified: Secondary | ICD-10-CM | POA: Diagnosis not present

## 2018-09-14 DIAGNOSIS — N390 Urinary tract infection, site not specified: Secondary | ICD-10-CM

## 2018-09-14 LAB — URINALYSIS, COMPLETE (UACMP) WITH MICROSCOPIC
RBC / HPF: >50 RBC/hpf (ref 0–5)
WBC, UA: >50 WBC/hpf (ref 0–5)

## 2018-09-14 MED ORDER — IBUPROFEN 600 MG PO TABS: 600.0000 mg | ORAL_TABLET | Freq: Four times a day (QID) | ORAL | 0 refills | Status: DC | PRN

## 2018-09-14 MED ORDER — CEPHALEXIN 500 MG PO CAPS: 500.0000 mg | ORAL_CAPSULE | Freq: Two times a day (BID) | ORAL | 0 refills | Status: DC

## 2018-09-14 MED ORDER — FLUCONAZOLE 150 MG PO TABS: 150.0000 mg | ORAL_TABLET | Freq: Every day | ORAL | 0 refills | Status: DC

## 2018-09-14 MED ORDER — PHENAZOPYRIDINE HCL 200 MG PO TABS: 200.0000 mg | ORAL_TABLET | Freq: Three times a day (TID) | ORAL | 0 refills | Status: DC | PRN

## 2018-09-14 NOTE — ED Triage Notes (Signed)
Patient c/o dysuria, urinary frequency that started 3 days ago.

## 2018-09-14 NOTE — Discharge Instructions (Addendum)
Push fluids.  Pyridium as needed for your symptoms.  Finish the Keflex, if you feel better.  You have prescription of Diflucan in case you get a yeast infection.  Ibuprofen 600 mg take with 1 g of Tylenol together 3 or 4 times a day as needed for pain.

## 2018-09-14 NOTE — ED Provider Notes (Signed)
HPI  SUBJECTIVE:  Jennifer Zamora is a 37 y.o. female who presents with 3 days of dysuria, urgency, frequency, cloudy, odorous urine, hematuria.  She denies vomiting, fevers, abdominal, pelvic, back pain.  No vaginal odor, itching, genital rash, vaginal discharge.  She is in a long-term monogamous relationship with a female who is asymptomatic, STDs are not a concern today.  She tried Uristat, ibuprofen.  The Uristat helps.  Symptoms are worse with urination.  No antibiotics in the past month.  No antipyretic in the past 4 to 6 hours, she is using new summers eve wipes/spray.  She has a past medical history of E. coli UTI, no history of pyelonephritis, nephrolithiasis, diabetes, hypertension.  She also has a history of yeast infections.  No history of gonorrhea, chlamydia, HIV, HSV, syphilis, Trichomonas, BV.  LMP: Amenorrheic, status post ablation.  Denies the possibility of being pregnant.  PMD: [facility].  Patient has been seen here several times for dysuria. she has had 2 E. coli UTIs on the last 2 visits her last culture was sensitive to Macrobid, Bactrim, Cipro, cefazolin.  Yeast infection on last visit.  History reviewed. No pertinent past medical history.  Past Surgical History:  Procedure Laterality Date  . CESAREAN SECTION    . CHOLECYSTECTOMY    . ENDOMETRIAL ABLATION      Family History  Problem Relation Age of Onset  . Diabetes Father     Social History   Tobacco Use  . Smoking status: Former Smoker    Last attempt to quit: 11/01/2017    Years since quitting: 0.8  . Smokeless tobacco: Never Used  Substance Use Topics  . Alcohol use: Yes    Comment: social  . Drug use: No    No current facility-administered medications for this encounter.   Current Outpatient Medications:  .  cephALEXin (KEFLEX) 500 MG capsule, Take 1 capsule (500 mg total) by mouth 2 (two) times daily., Disp: 14 capsule, Rfl: 0 .  fluconazole (DIFLUCAN) 150 MG tablet, Take 1 tablet (150 mg  total) by mouth daily. Take one pill orally, then Repeat at end of antibiotic, Disp: 2 tablet, Rfl: 0 .  ibuprofen (ADVIL,MOTRIN) 600 MG tablet, Take 1 tablet (600 mg total) by mouth every 6 (six) hours as needed., Disp: 30 tablet, Rfl: 0 .  phenazopyridine (PYRIDIUM) 200 MG tablet, Take 1 tablet (200 mg total) by mouth 3 (three) times daily as needed for pain., Disp: 6 tablet, Rfl: 0  No Known Allergies   ROS  As noted in HPI.   Physical Exam  BP (!) 134/97 (BP Location: Left Arm)   Pulse 97   Temp (!) 97.5 F (36.4 C) (Oral)   Resp 18   Ht 5\' 5"  (1.651 m)   Wt 104.3 kg   SpO2 98%   BMI 38.27 kg/m   Constitutional: Well developed, well nourished, no acute distress Eyes:  EOMI, conjunctiva normal bilaterally HENT: Normocephalic, atraumatic,mucus membranes moist Respiratory: Normal inspiratory effort Cardiovascular: Normal rate GI: nondistended. Soft NT, ND, no suprapubic, flank tenderness. Back: No CVAT skin: No rash, skin intact Musculoskeletal: no deformities Neurologic: Alert & oriented x 3, no focal neuro deficits Psychiatric: Speech and behavior appropriate   ED Course   Medications - No data to display  Orders Placed This Encounter  Procedures  . Urine culture    Standing Status:   Standing    Number of Occurrences:   1    Order Specific Question:   List patient's  active antibiotics    Answer:   keflex    Order Specific Question:   Patient immune status    Answer:   Normal  . Urinalysis, Complete w Microscopic    Standing Status:   Standing    Number of Occurrences:   1    Results for orders placed or performed during the hospital encounter of 09/14/18 (from the past 24 hour(s))  Urinalysis, Complete w Microscopic     Status: Abnormal   Collection Time: 09/14/18  8:33 AM  Result Value Ref Range   Color, Urine ORANGE (A) YELLOW   APPearance CLOUDY (A) CLEAR   Specific Gravity, Urine  1.005 - 1.030    TEST NOT REPORTED DUE TO COLOR INTERFERENCE OF  URINE PIGMENT   pH  5.0 - 8.0    TEST NOT REPORTED DUE TO COLOR INTERFERENCE OF URINE PIGMENT   Glucose, UA (A) NEGATIVE mg/dL    TEST NOT REPORTED DUE TO COLOR INTERFERENCE OF URINE PIGMENT   Hgb urine dipstick (A) NEGATIVE    TEST NOT REPORTED DUE TO COLOR INTERFERENCE OF URINE PIGMENT   Bilirubin Urine (A) NEGATIVE    TEST NOT REPORTED DUE TO COLOR INTERFERENCE OF URINE PIGMENT   Ketones, ur (A) NEGATIVE mg/dL    TEST NOT REPORTED DUE TO COLOR INTERFERENCE OF URINE PIGMENT   Protein, ur (A) NEGATIVE mg/dL    TEST NOT REPORTED DUE TO COLOR INTERFERENCE OF URINE PIGMENT   Nitrite (A) NEGATIVE    TEST NOT REPORTED DUE TO COLOR INTERFERENCE OF URINE PIGMENT   Leukocytes, UA (A) NEGATIVE    TEST NOT REPORTED DUE TO COLOR INTERFERENCE OF URINE PIGMENT   Squamous Epithelial / LPF 6-10 0 - 5   WBC, UA >50 0 - 5 WBC/hpf   RBC / HPF >50 0 - 5 RBC/hpf   Bacteria, UA MANY (A) NONE SEEN   No results found.  ED Clinical Impression  Urinary tract infection with hematuria, site unspecified   ED Assessment/Plan  Previous records reviewed.  As noted in HPI.  UA noted, but patient has been taking Uristat.  She does have however many bacteria.  Clinically, she has a UTI.  Home with Pyridium, Keflex as she states that that worked well for her last time.  States Macrobid gives her "really bad yeast infection".  Sending urine off for culture to confirm antibiotic choice.  Also Diflucan.  Continue Tylenol, ibuprofen, push fluids.  Discussed labs, MDM, treatment plan, and plan for follow-up with patient. Discussed sn/sx that should prompt return to the ED. patient agrees with plan.   Meds ordered this encounter  Medications  . fluconazole (DIFLUCAN) 150 MG tablet    Sig: Take 1 tablet (150 mg total) by mouth daily. Take one pill orally, then Repeat at end of antibiotic    Dispense:  2 tablet    Refill:  0  . cephALEXin (KEFLEX) 500 MG capsule    Sig: Take 1 capsule (500 mg total) by mouth 2  (two) times daily.    Dispense:  14 capsule    Refill:  0  . phenazopyridine (PYRIDIUM) 200 MG tablet    Sig: Take 1 tablet (200 mg total) by mouth 3 (three) times daily as needed for pain.    Dispense:  6 tablet    Refill:  0  . ibuprofen (ADVIL,MOTRIN) 600 MG tablet    Sig: Take 1 tablet (600 mg total) by mouth every 6 (six) hours as needed.  Dispense:  30 tablet    Refill:  0    *This clinic note was created using Scientist, clinical (histocompatibility and immunogenetics). Therefore, there may be occasional mistakes despite careful proofreading.   ?   Domenick Gong, MD 09/14/18 [id number]

## 2018-09-16 ENCOUNTER — Telehealth: Payer: Self-pay | Admitting: Emergency Medicine

## 2018-09-16 LAB — URINE CULTURE
Culture: >=100000 — AB
SPECIAL REQUESTS: NORMAL

## 2018-09-16 NOTE — Telephone Encounter (Signed)
Called Patient, demographics verified, results communicated, patient voiced understanding and states she is already much better, will continue plan of care and follow up as needed

## 2018-09-23 ENCOUNTER — Ambulatory Visit
Admission: EM | Admit: 2018-09-23 | Discharge: 2018-09-23 | Disposition: A | Discharge status: Home / Self Care | Payer: BLUE CROSS/BLUE SHIELD | Attending: Emergency Medicine | Admitting: Emergency Medicine

## 2018-09-23 DIAGNOSIS — R319 Hematuria, unspecified: Secondary | ICD-10-CM

## 2018-09-23 DIAGNOSIS — N390 Urinary tract infection, site not specified: Secondary | ICD-10-CM | POA: Diagnosis not present

## 2018-09-23 LAB — URINALYSIS, COMPLETE (UACMP) WITH MICROSCOPIC
Bilirubin Urine: NEGATIVE
GLUCOSE, UA: NEGATIVE mg/dL
KETONES UR: NEGATIVE mg/dL
NITRITE: NEGATIVE
PH: 6 (ref 5.0–8.0)
PROTEIN: NEGATIVE mg/dL
Specific Gravity, Urine: 1.01 (ref 1.005–1.030)
WBC, UA: >50 WBC/hpf (ref 0–5)

## 2018-09-23 MED ORDER — SULFAMETHOXAZOLE-TRIMETHOPRIM 800-160 MG PO TABS: 1.0000 | ORAL_TABLET | Freq: Two times a day (BID) | ORAL | 0 refills | Status: AC

## 2018-09-23 MED ORDER — FLUCONAZOLE 150 MG PO TABS: 150.0000 mg | ORAL_TABLET | Freq: Every day | ORAL | 0 refills | Status: DC

## 2018-09-23 NOTE — Discharge Instructions (Addendum)
Take medication as prescribed. Drink plenty of fluids.  ° °Follow up with your primary care physician this week as needed. Return to Urgent care for new or worsening concerns.  ° °

## 2018-09-23 NOTE — ED Triage Notes (Signed)
Was seen here recently and diagnosed with UTI.  Received ABX and since meds were completed her symptoms have returned.  Here for further eval.

## 2018-09-23 NOTE — ED Provider Notes (Signed)
[facility] ____________________________________________  Time seen: Approximately 9:10 AM  I have reviewed the triage vital signs and the nursing notes.   HISTORY  Chief Complaint Recurrent UTI   HPI Jennifer Zamora is a 37 y.o. female presenting for evaluation of burning with urination and some urinary frequency present since last night into this morning.  Patient reports feels same as the beginning of her UTIs that she has been having recently.  This is the third dysuria in the last 2 to 3 months.  Reports most recently seen on 09/14/2018 with dysuria symptoms and urine culture did grow positive for E. coli.  Was treated for Keflex which was sensitive.  Patient expressed concern as symptoms have returned.  Denies known trigger.  Continues to drink plenty of fluids and urinate when needed.  No recent sexual activity.  Denies concerns of STDs.  States does occasionally have some vaginal irritation similar to previous yeast infections, but states no other concerns of vaginal complaints.  Continues to eat and drink well.  Denies abdominal pain, back pain, chest pain, shortness of breath, fevers, vomiting or diarrhea.  Reports otherwise feels well.  No over-the-counter medications taken for same complaints today.   medical history denies  There are no active problems to display for this patient.   Past Surgical History:  Procedure Laterality Date  . CESAREAN SECTION    . CHOLECYSTECTOMY    . ENDOMETRIAL ABLATION       No current facility-administered medications for this encounter.   Current Outpatient Medications:  .  cephALEXin (KEFLEX) 500 MG capsule, Take 1 capsule (500 mg total) by mouth 2 (two) times daily., Disp: 14 capsule, Rfl: 0 .  fluconazole (DIFLUCAN) 150 MG tablet, Take 1 tablet (150 mg total) by mouth daily. Take one pill orally, then Repeat at end of antibiotic, Disp: 2 tablet, Rfl: 0 .  fluconazole (DIFLUCAN) 150 MG tablet, Take 1 tablet (150 mg total)  by mouth daily. Take one pill orally, then Repeat in one week as needed., Disp: 2 tablet, Rfl: 0 .  ibuprofen (ADVIL,MOTRIN) 600 MG tablet, Take 1 tablet (600 mg total) by mouth every 6 (six) hours as needed., Disp: 30 tablet, Rfl: 0 .  phenazopyridine (PYRIDIUM) 200 MG tablet, Take 1 tablet (200 mg total) by mouth 3 (three) times daily as needed for pain., Disp: 6 tablet, Rfl: 0 .  sulfamethoxazole-trimethoprim (BACTRIM DS,SEPTRA DS) 800-160 MG tablet, Take 1 tablet by mouth 2 (two) times daily for 7 days., Disp: 14 tablet, Rfl: 0  Allergies Patient has no known allergies.  Family History  Problem Relation Age of Onset  . Diabetes Father     Social History Social History   Tobacco Use  . Smoking status: Former Smoker    Last attempt to quit: 11/01/2017    Years since quitting: 0.8  . Smokeless tobacco: Never Used  Substance Use Topics  . Alcohol use: Yes    Comment: social  . Drug use: No    Review of Systems Constitutional: No fever Cardiovascular: Denies chest pain. Respiratory: Denies shortness of breath. Gastrointestinal: No abdominal pain.  No nausea, no vomiting.  No diarrhea.  Genitourinary: positive for dysuria. Musculoskeletal: Negative for back pain. Skin: Negative for rash.  ____________________________________________   PHYSICAL EXAM:  VITAL SIGNS: ED Triage Vitals  Enc Vitals Group     BP 09/23/18 0813 (!) 138/99     Pulse Rate 09/23/18 0813 85     Resp 09/23/18 0813 16  Temp 09/23/18 0813 98.3 F (36.8 C)     Temp Source 09/23/18 0813 Oral     SpO2 09/23/18 0813 100 %     Weight --      Height --      Head Circumference --      Peak Flow --      Pain Score 09/23/18 0815 4     Pain Loc --      Pain Edu? --      Excl. in GC? --     Constitutional: Alert and oriented. Well appearing and in no acute distress. Cardiovascular: Normal rate, regular rhythm. Grossly normal heart sounds.  Good peripheral circulation. Respiratory: Normal  respiratory effort without tachypnea nor retractions. Breath sounds are clear and equal bilaterally. No wheezes, rales, rhonchi. Gastrointestinal: Soft and nontender.  No CVA tenderness. Musculoskeletal: No midline cervical, thoracic or lumbar tenderness to palpation. Neurologic:  Normal speech and language. Speech is normal. No gait instability.  Skin:  Skin is warm, dry Psychiatric: Mood and affect are normal. Speech and behavior are normal. Patient exhibits appropriate insight and judgment   ___________________________________________   LABS (all labs ordered are listed, but only abnormal results are displayed)  Labs Reviewed  URINALYSIS, COMPLETE (UACMP) WITH MICROSCOPIC - Abnormal; Notable for the following components:      Result Value   Color, Urine STRAW (*)    APPearance CLOUDY (*)    Hgb urine dipstick TRACE (*)    Leukocytes, UA LARGE (*)    Bacteria, UA FEW (*)    All other components within normal limits  URINE CULTURE     PROCEDURES Procedures    INITIAL IMPRESSION / ASSESSMENT AND PLAN / ED COURSE  Pertinent labs & imaging results that were available during my care of the patient were reviewed by me and considered in my medical decision making (see chart for details).  Well-appearing patient.  No acute distress.  Presented for evaluation of dysuria.  Recent UTIs that were positive for E. coli culture.  Treated with Keflex, patient reports symptoms did fully resolve but have since returned again.  Urinalysis reviewed and discussed with patient.  Patient declines wet prep.  Will treat with Bactrim, as needed Diflucan and recommend follow-up with urology.  Information for urology given.Discussed indication, risks and benefits of medications with patient.  Encouraged fluids and supportive care.  Discussed follow up with Primary care physician this week as needed.Discussed follow up and return parameters including no resolution or any worsening concerns. Patient  verbalized understanding and agreed to plan.   ____________________________________________   FINAL CLINICAL IMPRESSION(S) / ED DIAGNOSES  Final diagnoses:  Urinary tract infection with hematuria, site unspecified     ED Discharge Orders         Ordered    sulfamethoxazole-trimethoprim (BACTRIM DS,SEPTRA DS) 800-160 MG tablet  2 times daily     09/23/18 0856    fluconazole (DIFLUCAN) 150 MG tablet  Daily     09/23/18 0856           Note: This dictation was prepared with Dragon dictation along with smaller phrase technology. Any transcriptional errors that result from this process are unintentional.         Renford Dills, NP 09/23/18 [phone]

## 2018-09-25 LAB — URINE CULTURE

## 2018-10-15 DIAGNOSIS — K3580 Unspecified acute appendicitis: Secondary | ICD-10-CM | POA: Diagnosis not present

## 2018-10-15 DIAGNOSIS — K3589 Other acute appendicitis without perforation or gangrene: Secondary | ICD-10-CM | POA: Diagnosis not present

## 2018-10-15 DIAGNOSIS — R1033 Periumbilical pain: Secondary | ICD-10-CM | POA: Diagnosis not present

## 2018-10-15 DIAGNOSIS — R1031 Right lower quadrant pain: Secondary | ICD-10-CM | POA: Diagnosis not present

## 2018-10-15 DIAGNOSIS — K36 Other appendicitis: Secondary | ICD-10-CM | POA: Diagnosis not present

## 2018-10-15 DIAGNOSIS — E669 Obesity, unspecified: Secondary | ICD-10-CM | POA: Diagnosis not present

## 2018-10-15 DIAGNOSIS — Z87891 Personal history of nicotine dependence: Secondary | ICD-10-CM | POA: Diagnosis not present

## 2018-10-15 DIAGNOSIS — N200 Calculus of kidney: Secondary | ICD-10-CM | POA: Diagnosis not present

## 2018-10-15 DIAGNOSIS — N838 Other noninflammatory disorders of ovary, fallopian tube and broad ligament: Secondary | ICD-10-CM | POA: Diagnosis not present

## 2018-10-15 DIAGNOSIS — Z9049 Acquired absence of other specified parts of digestive tract: Secondary | ICD-10-CM | POA: Diagnosis not present

## 2018-10-15 DIAGNOSIS — K37 Unspecified appendicitis: Secondary | ICD-10-CM | POA: Diagnosis not present

## 2018-10-15 DIAGNOSIS — K838 Other specified diseases of biliary tract: Secondary | ICD-10-CM | POA: Diagnosis not present

## 2018-10-15 DIAGNOSIS — Z6837 Body mass index (BMI) 37.0-37.9, adult: Secondary | ICD-10-CM | POA: Diagnosis not present

## 2018-10-15 DIAGNOSIS — K5790 Diverticulosis of intestine, part unspecified, without perforation or abscess without bleeding: Secondary | ICD-10-CM | POA: Diagnosis not present

## 2018-10-15 DIAGNOSIS — Z6839 Body mass index (BMI) 39.0-39.9, adult: Secondary | ICD-10-CM | POA: Diagnosis not present

## 2018-10-19 ENCOUNTER — Ambulatory Visit (INDEPENDENT_AMBULATORY_CARE_PROVIDER_SITE_OTHER): Payer: BLUE CROSS/BLUE SHIELD | Admitting: Obstetrics and Gynecology

## 2018-10-19 ENCOUNTER — Encounter: Payer: Self-pay | Admitting: Obstetrics and Gynecology

## 2018-10-19 VITALS — BP 127/88 | HR 106 | Ht 65.0 in | Wt 246.0 lb

## 2018-10-19 DIAGNOSIS — N390 Urinary tract infection, site not specified: Secondary | ICD-10-CM

## 2018-10-19 DIAGNOSIS — Z131 Encounter for screening for diabetes mellitus: Secondary | ICD-10-CM | POA: Diagnosis not present

## 2018-10-19 DIAGNOSIS — N83201 Unspecified ovarian cyst, right side: Secondary | ICD-10-CM | POA: Diagnosis not present

## 2018-10-19 MED ORDER — NITROFURANTOIN MONOHYD MACRO 100 MG PO CAPS: 100.0000 mg | ORAL_CAPSULE | Freq: Two times a day (BID) | ORAL | 1 refills | Status: DC

## 2018-10-19 MED ORDER — TRIMETHOPRIM 100 MG PO TABS: 100.0000 mg | ORAL_TABLET | Freq: Every day | ORAL | 1 refills | Status: DC

## 2018-10-19 NOTE — Patient Instructions (Signed)
Definition of recurrent urinary tract infections is 2 culture proven episodes in 6 months, or 3 culture proven episodes in 1 year with individual episodes separated by at least two weeks or documentation of differing urinary tract pathogen.  Relapse of urinary tract infection is symptoms within 2 weeks of adequate treatment, or recurrence of symptoms within 2 weeks and documentation of the same causative urinary tract pathogen.      "American Urogynecologic Society Best-Practice Statement: Recurrent Urinary Tract Infection in Adult Women" Female Pelvic Medicine & Reconstructive Surgery Volume 24, Number 5, September/October 2018  

## 2018-10-19 NOTE — Progress Notes (Signed)
Obstetrics & Gynecology Office Visit   Chief Complaint:  Chief Complaint  Patient presents with  . Ovarian Cyst    [facility] referral  . Urinary Tract Infection    History of Present Illness: The patient is a 37 y.o. female presenting for evalution concerning a incidental right adnexal enlargement.  Initial presentation was prompted by acute appendicitis with appendectomy on 10/15/2018 at [facility].  At that time noted "The right ovary was slightly enlarged and minimally congested.".  Intraoperative gyn consultation was not obtained.  Preoperative CT scan 10/15/2018 "PELVIC/REPRODUCTIVE ORGANS: Unremarkable."    The patient is feeling well postoperatively.  She denies any RLQ discomfort with most of her discomfort localized to the supraumbilical port site.  She is not currently on any hormonal contraception.  No menstrual concerns following prior endometrial ablation 6 years ago, using tubal ligation for contraception.    [name] is a 37 y.o. G3P3 who LMP was No LMP recorded. Patient has had an ablation., presents today for a problem visit.  She complains of abnormal smelling urine, burning with urination, frequency and urgency .  Symptoms are moderate.  Patient denies back pain. Patient does have a history of recurrent UTI,  does not have a history of pyelonephritis, does have a history of nephrolithiasis (based on CT scan 10/15/2018).  She has not had previous treatment for her current symptoms.    Review of Systems: 10 point review of systems negative unless otherwise noted in HPI  Past Medical History:  History reviewed. No pertinent past medical history.  Past Surgical History:  Past Surgical History:  Procedure Laterality Date  . CESAREAN SECTION    . CHOLECYSTECTOMY    . ENDOMETRIAL ABLATION      Gynecologic History: No LMP recorded. Patient has had an ablation.  Obstetric History: G3P3  Family History:  Family History  Problem Relation Age of Onset  . Diabetes Father       Social History:  Social History   Socioeconomic History  . Marital status: Single    Spouse name: Not on file  . Number of children: Not on file  . Years of education: Not on file  . Highest education level: Not on file  Occupational History  . Not on file  Social Needs  . Financial resource strain: Not on file  . Food insecurity:    Worry: Not on file    Inability: Not on file  . Transportation needs:    Medical: Not on file    Non-medical: Not on file  Tobacco Use  . Smoking status: Former Smoker    Last attempt to quit: 11/01/2017    Years since quitting: 0.9  . Smokeless tobacco: Never Used  Substance and Sexual Activity  . Alcohol use: Yes    Comment: social  . Drug use: No  . Sexual activity: Not on file  Lifestyle  . Physical activity:    Days per week: Not on file    Minutes per session: Not on file  . Stress: Not on file  Relationships  . Social connections:    Talks on phone: Not on file    Gets together: Not on file    Attends religious service: Not on file    Active member of club or organization: Not on file    Attends meetings of clubs or organizations: Not on file    Relationship status: Not on file  . Intimate partner violence:    Fear of current or ex  partner: Not on file    Emotionally abused: Not on file    Physically abused: Not on file    Forced sexual activity: Not on file  Other Topics Concern  . Not on file  Social History Narrative  . Not on file    Allergies:  No Known Allergies  Medications: Prior to Admission medications   Medication Sig Start Date End Date Taking? Authorizing Provider  ibuprofen (ADVIL,MOTRIN) 600 MG tablet Take 1 tablet (600 mg total) by mouth every 6 (six) hours as needed. 09/14/18  Yes Domenick Gong, MD  phenazopyridine (PYRIDIUM) 200 MG tablet Take 1 tablet (200 mg total) by mouth 3 (three) times daily as needed for pain. 09/14/18  Yes Domenick Gong, MD  cephALEXin (KEFLEX) 500 MG capsule Take  1 capsule (500 mg total) by mouth 2 (two) times daily. Patient not taking: Reported on 10/19/2018 09/14/18   Domenick Gong, MD  fluconazole (DIFLUCAN) 150 MG tablet Take 1 tablet (150 mg total) by mouth daily. Take one pill orally, then Repeat in one week as needed. Patient not taking: Reported on 10/19/2018 09/23/18   Renford Dills, NP  nitrofurantoin, macrocrystal-monohydrate, (MACROBID) 100 MG capsule Take 1 capsule (100 mg total) by mouth 2 (two) times daily. 10/19/18   Vena Austria, MD  trimethoprim (TRIMPEX) 100 MG tablet Take 1 tablet (100 mg total) by mouth daily. 10/19/18   Vena Austria, MD    Physical Exam Vitals:  Vitals:   10/19/18 1501  BP: 127/88  Pulse: (!) 106   No LMP recorded. Patient has had an ablation.  General: NAD HEENT: normocephalic, anicteric Cardiovascular: RRR, distal pulses 2+ Abdomen: NABS, soft, non-tender, non-distended.  Umbilicus without lesions.  No hepatomegaly, splenomegaly or masses palpable. No evidence of hernia  Genitourinary:  Trocar sites D/C/I Extremities: no edema, erythema, or tenderness Neurologic: Grossly intact Psychiatric: mood appropriate, affect full  Female chaperone present for pelvic and breast  portions of the physical exam  No results found.  Assessment: 37 y.o. G3P3 presenting for right adnexal enlargement  Plan: Problem List Items Addressed This Visit    None    Visit Diagnoses    Recurrent UTI    -  Primary   Relevant Medications   trimethoprim (TRIMPEX) 100 MG tablet   nitrofurantoin, macrocrystal-monohydrate, (MACROBID) 100 MG capsule   Other Relevant Orders   Hemoglobin A1c (Completed)   Urine Culture   Right ovarian cyst       Relevant Orders   US Transvaginal Non-OB   Screening for diabetes mellitus       Relevant Orders   Hemoglobin A1c (Completed)       1)The incidence and implication of adnexal masses and ovarian cysts were discussed with the patient in detail.  Prior imaging if  available was reviewed at today's visit..  The vast majority of these lesions will represent benign or physiologic processes and may well resolve on repeat imaging with expectant management.  We discussed that in a premenopausal patient not on ovulation suppression with via a systemic form hormonal contraception the normal function of the ovary during follicular development is the formation of a dominant follicle or cyst(s) every month.  This is an essential part of normal reproductive physiology.  In some cases these cysts can take on larger dimensions, hemorrhage, or undergo torsion making them symptomatic. Torsion is relatively unlikely for lesions under 5 cm.  Based on initial imaging findings the overall concern for malignancy is deemed low.  We will obtain follow up  imagine approximately 6 weeks from the date of the initial imaging study.  Torsion precautions were given.   - given normal CT and only subjective enlargement of the right ovary without comparison the the left ovary this may represent hyperthecosis secondary to underlying PCOS, pelvic congestion syndrome, or potential small corpus luteum cyst.     2) Tumor makers were not ordered  3) Recurrent UTI   Definition of recurrent urinary tract infections is 2 culture proven episodes in 6 months, or 3 culture proven episodes in 1 year with individual episodes separated by at least two weeks or documentation of differing urinary tract pathogen.  Relapse of urinary tract infection is symptoms within 2 weeks of adequate treatment, or recurrence of symptoms within 2 weeks and documentation of the same causative urinary tract pathogen.      Database administrator"American Urogynecologic Society Best-Practice Statement: Recurrent Urinary Tract Infection in Adult Women" Female Pelvic Medicine & Reconstructive Surgery Volume 24, Number 5, September/October 2018  - 7 day course of macrobid - 6 months course of once daily trimethoprim 100mg  suppression  4) Return in  about 6 weeks (around 11/30/2018) for GYN and TVUS .   Vena AustriaAndreas Tahliyah Anagnos, MD, Merlinda FrederickFACOG [facility], [facility]

## 2018-10-20 LAB — HEMOGLOBIN A1C
ESTIMATED AVERAGE GLUCOSE: 117 mg/dL
HEMOGLOBIN A1C: 5.7 % — AB (ref 4.8–5.6)

## 2018-10-21 LAB — URINE CULTURE

## 2018-11-10 ENCOUNTER — Other Ambulatory Visit: Payer: Self-pay

## 2018-11-10 ENCOUNTER — Encounter: Payer: Self-pay | Admitting: Emergency Medicine

## 2018-11-10 ENCOUNTER — Ambulatory Visit
Admission: EM | Admit: 2018-11-10 | Discharge: 2018-11-10 | Disposition: A | Discharge status: Home / Self Care | Payer: BLUE CROSS/BLUE SHIELD | Attending: Family Medicine | Admitting: Family Medicine

## 2018-11-10 DIAGNOSIS — T8149XA Infection following a procedure, other surgical site, initial encounter: Secondary | ICD-10-CM | POA: Diagnosis not present

## 2018-11-10 DIAGNOSIS — Z9089 Acquired absence of other organs: Secondary | ICD-10-CM | POA: Diagnosis not present

## 2018-11-10 HISTORY — DX: Urinary tract infection, site not specified: N39.0

## 2018-11-10 MED ORDER — DOXYCYCLINE HYCLATE 100 MG PO CAPS: 100.0000 mg | ORAL_CAPSULE | Freq: Two times a day (BID) | ORAL | 0 refills | Status: DC

## 2018-11-10 MED ORDER — MUPIROCIN 2 % EX OINT: 1.0000 "application " | TOPICAL_OINTMENT | Freq: Two times a day (BID) | CUTANEOUS | 0 refills | Status: AC

## 2018-11-10 NOTE — ED Triage Notes (Signed)
Patient in today c/o incisive site drainage and redness x 2 days. Patient had laparoscopic appendectomy on 10/15/18.

## 2018-11-10 NOTE — ED Provider Notes (Signed)
[facility]    CSN: [id number] Arrival date & time: 11/10/18  1848  History   Chief Complaint Chief Complaint  Patient presents with  . incision site infection   HPI  37 year old female presents with the above complaint.  Patient had laparoscopic appendectomy on 11/23.  Patient states that for the past 2 days one of her surgical sites has been red, draining, and painful. Location: Superior to the umbilicus.  No reports of fever.  Patient believes that the site is infected.  No medications or interventions tried.  Pain is moderate to severe typically with palpation of the wound.  No other reported symptoms.  No other complaints at this time.  PMH, Surgical Hx, Family Hx, Social History reviewed and updated as below.  Past Medical History:  Diagnosis Date  . Recurrent UTI    Past Surgical History:  Procedure Laterality Date  . APPENDECTOMY    . CESAREAN SECTION    . CHOLECYSTECTOMY    . ENDOMETRIAL ABLATION      OB History    Gravida  3   Para  3   Term      Preterm      AB      Living  3     SAB      TAB      Ectopic      Multiple      Live Births             Home Medications    Prior to Admission medications   Medication Sig Start Date End Date Taking? Authorizing Provider  trimethoprim (TRIMPEX) 100 MG tablet Take 1 tablet (100 mg total) by mouth daily. 10/19/18  Yes Vena AustriaStaebler, Andreas, MD  doxycycline (VIBRAMYCIN) 100 MG capsule Take 1 capsule (100 mg total) by mouth 2 (two) times daily. 11/10/18   Tommie Samsook, Colin Ellers G, DO  mupirocin ointment (BACTROBAN) 2 % Apply 1 application topically 2 (two) times daily for 7 days. 11/10/18 11/17/18  Tommie Samsook, Denia Mcvicar G, DO    Family History Family History  Problem Relation Age of Onset  . Healthy Mother   . Diabetes Father     Social History Social History   Tobacco Use  . Smoking status: Former Smoker    Last attempt to quit: 11/01/2017    Years since quitting: 1.0  . Smokeless tobacco: Never  Used  Substance Use Topics  . Alcohol use: Yes    Comment: social  . Drug use: No   Allergies   Patient has no known allergies.  Review of Systems Review of Systems  Constitutional: Negative for fever.  Skin: Positive for wound.   Physical Exam Triage Vital Signs ED Triage Vitals  Enc Vitals Group     BP 11/10/18 1901 (!) 141/101     Pulse Rate 11/10/18 1901 (!) 106     Resp 11/10/18 1901 16     Temp 11/10/18 1901 98.4 F (36.9 C)     Temp Source 11/10/18 1901 Oral     SpO2 11/10/18 1901 99 %     Weight 11/10/18 1900 230 lb (104.3 kg)     Height 11/10/18 1900 5\' 5"  (1.651 m)     Head Circumference --      Peak Flow --      Pain Score 11/10/18 1859 7     Pain Loc --      Pain Edu? --      Excl. in GC? --    No data found.  Updated Vital Signs BP (!) 141/101 (BP Location: Left Arm)   Pulse (!) 106   Temp 98.4 F (36.9 C) (Oral)   Resp 16   Ht 5\' 5"  (1.651 m)   Wt 104.3 kg   SpO2 99%   BMI 38.27 kg/m   Visual Acuity Right Eye Distance:   Left Eye Distance:   Bilateral Distance:    Right Eye Near:   Left Eye Near:    Bilateral Near:     Physical Exam Vitals signs and nursing note reviewed.  Constitutional:      General: She is not in acute distress.    Appearance: She is not ill-appearing.  HENT:     Head: Normocephalic and atraumatic.     Nose: Nose normal.  Eyes:     General:        Right eye: No discharge.        Left eye: No discharge.     Conjunctiva/sclera: Conjunctivae normal.  Pulmonary:     Effort: Pulmonary effort is normal. No respiratory distress.  Skin:    Comments: Surgical wound just superior to the umbilicus has yellow drainage.  Surrounding erythema.  Underlying induration.  No appreciable fluctuant abscess to drain.  Neurological:     Mental Status: She is alert.  Psychiatric:        Mood and Affect: Mood normal.        Thought Content: Thought content normal.    UC Treatments / Results  Labs (all labs ordered are  listed, but only abnormal results are displayed) Labs Reviewed - No data to display  EKG None  Radiology No results found.  Procedures Procedures (including critical care time)  Medications Ordered in UC Medications - No data to display  Initial Impression / Assessment and Plan / UC Course  I have reviewed the triage vital signs and the nursing notes.  Pertinent labs & imaging results that were available during my care of the patient were reviewed by me and considered in my medical decision making (see chart for details).    37 year old female presents with surgical site infection.  Patient is to hold her daily trimethoprim which she is using for prophylaxis for UTI.  Doxycycline as prescribed.  Topical Bactroban ointment as well.  If she fails to improve in the next 48 hours, she should contact her surgeon.  Final Clinical Impressions(s) / UC Diagnoses   Final diagnoses:  Surgical site infection     Discharge Instructions     Antibiotics as prescribed.  Hold Trimethoprim while on the Doxy.  Call surgeon if worsens or does not improve in 48 hours.  Take care  Dr. Adriana Simasook    ED Prescriptions    Medication Sig Dispense Auth. Provider   doxycycline (VIBRAMYCIN) 100 MG capsule Take 1 capsule (100 mg total) by mouth 2 (two) times daily. 14 capsule Josiah Nieto G, DO   mupirocin ointment (BACTROBAN) 2 % Apply 1 application topically 2 (two) times daily for 7 days. 22 g Tommie Samsook, Asanti Craigo G, DO     Controlled Substance Prescriptions [place] Controlled Substance Registry consulted? Not Applicable   Tommie SamsCook, Sherell Christoffel G, DO 11/10/18 1920

## 2018-11-10 NOTE — Discharge Instructions (Signed)
Antibiotics as prescribed.  Hold Trimethoprim while on the Doxy.  Call surgeon if worsens or does not improve in 48 hours.  Take care  Dr. Adriana Simasook

## 2018-11-12 DIAGNOSIS — T8141XA Infection following a procedure, superficial incisional surgical site, initial encounter: Secondary | ICD-10-CM | POA: Diagnosis not present

## 2018-11-12 DIAGNOSIS — M799 Soft tissue disorder, unspecified: Secondary | ICD-10-CM | POA: Diagnosis not present

## 2018-11-12 DIAGNOSIS — Z6841 Body Mass Index (BMI) 40.0 and over, adult: Secondary | ICD-10-CM | POA: Diagnosis not present

## 2018-11-13 DIAGNOSIS — T8141XA Infection following a procedure, superficial incisional surgical site, initial encounter: Secondary | ICD-10-CM | POA: Diagnosis not present

## 2018-11-13 DIAGNOSIS — T8140XA Infection following a procedure, unspecified, initial encounter: Secondary | ICD-10-CM | POA: Diagnosis not present

## 2018-11-13 DIAGNOSIS — N390 Urinary tract infection, site not specified: Secondary | ICD-10-CM | POA: Diagnosis not present

## 2018-11-14 DIAGNOSIS — T8141XA Infection following a procedure, superficial incisional surgical site, initial encounter: Secondary | ICD-10-CM | POA: Diagnosis not present

## 2018-11-28 ENCOUNTER — Encounter: Payer: Self-pay | Admitting: Obstetrics and Gynecology

## 2018-11-28 ENCOUNTER — Other Ambulatory Visit: Payer: BLUE CROSS/BLUE SHIELD

## 2018-11-28 ENCOUNTER — Ambulatory Visit (INDEPENDENT_AMBULATORY_CARE_PROVIDER_SITE_OTHER): Payer: Commercial Managed Care - PPO

## 2018-11-28 ENCOUNTER — Ambulatory Visit (INDEPENDENT_AMBULATORY_CARE_PROVIDER_SITE_OTHER): Payer: Commercial Managed Care - PPO | Admitting: Obstetrics and Gynecology

## 2018-11-28 VITALS — BP 132/92 | HR 100 | Ht 65.0 in | Wt 244.0 lb

## 2018-11-28 DIAGNOSIS — N83201 Unspecified ovarian cyst, right side: Secondary | ICD-10-CM | POA: Diagnosis not present

## 2018-11-28 DIAGNOSIS — B373 Candidiasis of vulva and vagina: Secondary | ICD-10-CM

## 2018-11-28 DIAGNOSIS — B3731 Acute candidiasis of vulva and vagina: Secondary | ICD-10-CM

## 2018-11-28 MED ORDER — FLUCONAZOLE 150 MG PO TABS: 150.0000 mg | ORAL_TABLET | Freq: Once | ORAL | 0 refills | Status: AC

## 2018-11-29 NOTE — Progress Notes (Signed)
Gynecology Ultrasound Follow Up  Chief Complaint:  Chief Complaint  Patient presents with  . Follow-up    GYN U/S     History of Present Illness: Patient is a 38 y.o. female who presents today for ultrasound evaluation of right ovarian cyst .  Ultrasound demonstrates the following findgins Adnexa: no masses seen Uterus: Non-enlarged, no fibroid, with endometrial stripe showing post ablation changes Additional: no free fluid  No further abdominal pain.  Patient was admitted for IV antibiotics for a port site skin infection in the interim since our last visit.  Does feel like she has a candida infection following antibiotics.  Review of Systems: Review of Systems  Constitutional: Negative.   Gastrointestinal: Negative.   Genitourinary: Negative.     Past Medical History:  Past Medical History:  Diagnosis Date  . Recurrent UTI     Past Surgical History:  Past Surgical History:  Procedure Laterality Date  . APPENDECTOMY    . CESAREAN SECTION    . CHOLECYSTECTOMY    . ENDOMETRIAL ABLATION      Gynecologic History:  No LMP recorded. Patient has had an ablation.  Family History:  Family History  Problem Relation Age of Onset  . Healthy Mother   . Diabetes Father     Social History:  Social History   Socioeconomic History  . Marital status: Single    Spouse name: Not on file  . Number of children: Not on file  . Years of education: Not on file  . Highest education level: Not on file  Occupational History  . Not on file  Social Needs  . Financial resource strain: Not on file  . Food insecurity:    Worry: Not on file    Inability: Not on file  . Transportation needs:    Medical: Not on file    Non-medical: Not on file  Tobacco Use  . Smoking status: Former Smoker    Last attempt to quit: 11/01/2017    Years since quitting: 1.0  . Smokeless tobacco: Never Used  Substance and Sexual Activity  . Alcohol use: Yes    Comment: social  . Drug use: No    . Sexual activity: Not Currently  Lifestyle  . Physical activity:    Days per week: Not on file    Minutes per session: Not on file  . Stress: Not on file  Relationships  . Social connections:    Talks on phone: Not on file    Gets together: Not on file    Attends religious service: Not on file    Active member of club or organization: Not on file    Attends meetings of clubs or organizations: Not on file    Relationship status: Not on file  . Intimate partner violence:    Fear of current or ex partner: Not on file    Emotionally abused: Not on file    Physically abused: Not on file    Forced sexual activity: Not on file  Other Topics Concern  . Not on file  Social History Narrative  . Not on file    Allergies:  No Known Allergies  Medications: Prior to Admission medications   Medication Sig Start Date End Date Taking? Authorizing Provider  trimethoprim (TRIMPEX) 100 MG tablet Take 1 tablet (100 mg total) by mouth daily. 10/19/18  Yes Vena AustriaStaebler, Jaelin Fackler, MD    Physical Exam Vitals: Blood pressure (!) 132/92, pulse 100, height 5\' 5"  (1.651 m), weight 244  lb (110.7 kg).  General: NAD HEENT: normocephalic, anicteric Pulmonary: No increased work of breathing Extremities: no edema, erythema, or tenderness Abdomen: soft, non-tender, non-distended, umbilical trocar site with small packing strip in place Neurologic: Grossly intact, normal gait Psychiatric: mood appropriate, affect full  US Transvaginal Non-ob  Result Date: 11/28/2018 Patient Name: Jennifer Zamora DOB: [birth year] MRN: [id number] ULTRASOUND REPORT Location: [facility] Date of Service: 11/28/2018 Indications: Abnormal ovary seen during apendectomy Findings: The uterus is anteverted and measures 8.6 x 4.8 x 4.1cm. Echo texture is heterogenous without evidence of focal masses. The cervical canal and c/s scar has trace amount of complex fluid within. Endometrial line is indistinct within trace amount of fluid within.  Right Ovary measures 4.0 x 2.6 x 2.1cm. Difficult to see transvaginally due to lateral location. Appears grossly normal transabdominally with small calcifications seen. Left Ovary measures 3.0 x 2.8 x 1.7cm. It is normal in appearance. Survey of the adnexa demonstrates no adnexal masses. There is no free fluid in the cul de sac. Impression: 1. Heterogeneous uterus with indistinct endometrial lining. 2. Complex fluid within the cervical canal and c/s scar. Recommendations: 1.Clinical correlation with the patient's History and Physical Exam. Darlina Guys, RDMS RVT Images reviewed.  Normal GYN study without visualized pathology.  Vena Austria, MD, Evern Core [facility], [facility] 11/28/2018, 12:10 PM     Assessment: 38 y.o. G3P3 follow up right ovarian cyst, vaginal candida.    Plan: Problem List Items Addressed This Visit    None    Visit Diagnoses    Yeast vaginitis    -  Primary   Right ovarian cyst          1) Right ovarian cyst - functional given resolution on follow up imaging.  No further imaging follow indicated at this time.    2) Vaginal candida following Abx exposure - rx diflucan  3) A total of 15 minutes were spent in face-to-face contact with the patient during this encounter with over half of that time devoted to counseling and coordination of care.  4) Return in about 1 year (around 11/29/2019) for annual.   Vena Austria, MD, [facility], [facility] 11/29/2018, 1:14 PM

## 2019-03-16 ENCOUNTER — Encounter: Payer: Self-pay | Admitting: Emergency Medicine

## 2019-03-16 ENCOUNTER — Other Ambulatory Visit: Payer: Self-pay

## 2019-03-16 ENCOUNTER — Ambulatory Visit
Admission: EM | Admit: 2019-03-16 | Discharge: 2019-03-16 | Disposition: A | Discharge status: Home / Self Care | Payer: Commercial Managed Care - PPO | Attending: Emergency Medicine | Admitting: Emergency Medicine

## 2019-03-16 DIAGNOSIS — H1033 Unspecified acute conjunctivitis, bilateral: Secondary | ICD-10-CM | POA: Diagnosis not present

## 2019-03-16 MED ORDER — FLUORESCEIN SODIUM 1 MG OP STRP
1.0000 | ORAL_STRIP | Freq: Once | OPHTHALMIC | Status: AC
  Administered 2019-03-16: 1 via OPHTHALMIC

## 2019-03-16 MED ORDER — TETRACAINE HCL 0.5 % OP SOLN
1.0000 [drp] | Freq: Once | OPHTHALMIC | Status: AC
  Administered 2019-03-16: 15:00:00 1 [drp] via OPHTHALMIC

## 2019-03-16 MED ORDER — MOXIFLOXACIN HCL 0.5 % OP SOLN: 1.0000 [drp] | Freq: Three times a day (TID) | OPHTHALMIC | 0 refills | Status: AC

## 2019-03-16 NOTE — ED Provider Notes (Addendum)
[facility] ____________________________________________  Time seen: Approximately 2:48 PM  I have reviewed the triage vital signs and the nursing notes.   HISTORY  Chief Complaint Eye Problem (bilateral)   HPI Jennifer Zamora is a 38 y.o. female presenting for evaluation of bilateral eye redness, itching and watering for the last 2 weeks.  States initially it was only itching and some watery eyes.  States she has continued taken Benadryl for her allergies, using over the counter eye drops, and then states she did not online med visit and received Patanol which did not resolve itching and redness.  States she is continued with bilateral eye itching, redness and some drainage that has turned more into itching with discomfort and intermittent photosensitivity. Denies foreign body sensation.  Does normally wear contacts, has not been using her contacts recently.  Denies injury, trauma, foreign body sensation. Denies others with similar.  States vision has been somewhat more blurry than normal.  Baseline seasonal allergies with eye itching and nasal congestion.  Denies cough, sore throat, fevers, sinus pain, chest pain or shortness of breath.  Continues to eat and drink well.  Denies any other known changes in triggers or contacts.  Denies other aggravating or alleviating factors.  No LMP recorded. Patient has had an ablation.    Past Medical History:  Diagnosis Date  . Recurrent UTI     There are no active problems to display for this patient.   Past Surgical History:  Procedure Laterality Date  . APPENDECTOMY    . CESAREAN SECTION    . CHOLECYSTECTOMY    . ENDOMETRIAL ABLATION       No current facility-administered medications for this encounter.   Current Outpatient Medications:  .  trimethoprim (TRIMPEX) 100 MG tablet, Take 1 tablet (100 mg total) by mouth daily., Disp: 90 tablet, Rfl: 1 .  moxifloxacin (VIGAMOX) 0.5 % ophthalmic solution, Place 1 drop into both  eyes 3 (three) times daily for 7 days., Disp: 3 mL, Rfl: 0  Allergies Patient has no known allergies.  Family History  Problem Relation Age of Onset  . Healthy Mother   . Diabetes Father     Social History Social History   Tobacco Use  . Smoking status: Former Smoker    Last attempt to quit: 11/01/2017    Years since quitting: 1.3  . Smokeless tobacco: Never Used  Substance Use Topics  . Alcohol use: Yes    Comment: social  . Drug use: No    Review of Systems Constitutional: No fever/chills Eyes: As above. ENT: No sore throat. Cardiovascular: Denies chest pain. Respiratory: Denies shortness of breath. Skin: Negative for rash.   ____________________________________________   PHYSICAL EXAM:  VITAL SIGNS: ED Triage Vitals  Enc Vitals Group     BP 03/16/19 1438 138/82     Pulse Rate 03/16/19 1438 80     Resp 03/16/19 1438 16     Temp 03/16/19 1438 98.4 F (36.9 C)     Temp Source 03/16/19 1438 Oral     SpO2 03/16/19 1438 100 %     Weight 03/16/19 1434 230 lb (104.3 kg)     Height 03/16/19 1434 5\' 5"  (1.651 m)     Head Circumference --      Peak Flow --      Pain Score 03/16/19 1434 5     Pain Loc --      Pain Edu? --      Excl. in GC? --  Visual Acuity  Right Eye Distance: 20/50(corrected, but old rx glasses) Left Eye Distance: 20/50(corrected, but old rx glasses) Bilateral Distance: 20/40(corrected but old rx glasses)    Constitutional: Alert and oriented. Well appearing and in no acute distress. Eyes: Keeps both eyes fully open. Bilateral conjunctival mild diffuse injection.  Minimal tenderness to palpation bilaterally.  No surrounding erythema, tenderness or edema bilaterally.  No active drainage bilaterally.  PERRL. EOMI. no pain with EOMs.  Bilateral eyes examined with tetracaine and fluorescein.  No abrasion, foreign bodies, opaqueness or spots noted. ENT      Head: Normocephalic and atraumatic.  No frontal or maxillary sinus tenderness  palpation.      Nose: No congestion      Mouth/Throat: Mucous membranes are moist. Cardiovascular: Normal rate, regular rhythm. Grossly normal heart sounds.  Good peripheral circulation. Respiratory: Normal respiratory effort without tachypnea nor retractions. Breath sounds are clear and equal bilaterally. No wheezes, rales, rhonchi. Musculoskeletal: Steady gait. Neurologic:  Normal speech and language.Speech is normal. No gait instability.  Skin:  Skin is warm, dry and intact. No rash noted. Psychiatric: Mood and affect are normal. Speech and behavior are normal. Patient exhibits appropriate insight and judgment   ___________________________________________   LABS (all labs ordered are listed, but only abnormal results are displayed)  Labs Reviewed - No data to display ____________________________________________   PROCEDURES Procedures  Eye exam Procedure explained and verbal consent obtained.  Anesthesia: tetracaine ophthalmic 2 drops to bilateral eyes Bilateral eyes examined with fluorescein strip.  No foreign bodies visualized. No corneal abrasion noted.  No opaqueness or white spots noted.  Patient tolerated well.    INITIAL IMPRESSION / ASSESSMENT AND PLAN / ED COURSE  Pertinent labs & imaging results that were available during my care of the patient were reviewed by me and considered in my medical decision making (see chart for details).  Well-appearing patient.  No acute distress.  Suspect recent allergic conjunctivitis.  However suspect now turned bacterial.  Counseled to continue not utilizing her contacts at this time and use only glasses.  Good hand hygiene, avoid rubbing and itching.  Will treat with Vigamox and recommend to try other over-the-counter allergy medication other than benadryl.  Supportive care.  Follow-up with ophthalmology this week is not quickly improving. further discussed sooner return parameters including worsening, not improving or worsening  concerns..Discussed indication, risks and benefits of medications with patient.  Discussed follow up and return parameters including no resolution or any worsening concerns. Patient verbalized understanding and agreed to plan.   ____________________________________________   FINAL CLINICAL IMPRESSION(S) / ED DIAGNOSES  Final diagnoses:  Acute bacterial conjunctivitis of both eyes     ED Discharge Orders         Ordered    moxifloxacin (VIGAMOX) 0.5 % ophthalmic solution  3 times daily     03/16/19 1506           Note: This dictation was prepared with Dragon dictation along with smaller phrase technology. Any transcriptional errors that result from this process are unintentional.         Renford Dills, NP 03/16/19 [phone]

## 2019-03-16 NOTE — Discharge Instructions (Addendum)
Take medication as prescribed. Avoid rubbing. Change allergy medication. Cool compresses. No contacts for 1 week, and until fully resolved.   Follow up with opthalmology this week as discussed.   Follow up with your primary care physician this week as needed. Return to Urgent care for new or worsening concerns.

## 2019-03-16 NOTE — ED Triage Notes (Signed)
Pt c/o bilateral eye pain, itching, pain, photosensitivity and redness. Started about 2 weeks ago. She states that she has tried multiple thing OTC and nothing is working.

## 2019-05-29 ENCOUNTER — Other Ambulatory Visit: Payer: Self-pay | Admitting: Obstetrics and Gynecology

## 2019-10-24 DIAGNOSIS — I319 Disease of pericardium, unspecified: Secondary | ICD-10-CM

## 2019-10-24 HISTORY — DX: Disease of pericardium, unspecified: I31.9

## 2019-11-24 DIAGNOSIS — M329 Systemic lupus erythematosus, unspecified: Secondary | ICD-10-CM

## 2019-11-24 DIAGNOSIS — M3500 Sicca syndrome, unspecified: Secondary | ICD-10-CM

## 2019-11-24 HISTORY — DX: Systemic lupus erythematosus, unspecified: M32.9

## 2019-11-24 HISTORY — DX: Sjogren syndrome, unspecified: M35.00

## 2020-03-06 ENCOUNTER — Telehealth: Payer: Self-pay | Admitting: Obstetrics and Gynecology

## 2020-03-06 NOTE — Telephone Encounter (Signed)
Patient requesting appointment thru my chart. Called and left voicemail for patient to call back to be scheduled.

## 2020-03-22 ENCOUNTER — Ambulatory Visit: Payer: Commercial Managed Care - PPO | Admitting: Obstetrics and Gynecology

## 2020-03-27 ENCOUNTER — Other Ambulatory Visit: Payer: Self-pay

## 2020-03-27 ENCOUNTER — Other Ambulatory Visit (HOSPITAL_COMMUNITY)
Admission: RE | Admit: 2020-03-27 | Discharge: 2020-03-27 | Disposition: A | Discharge status: Home / Self Care | Payer: Commercial Managed Care - PPO | Source: Ambulatory Visit | Attending: Obstetrics and Gynecology | Admitting: Obstetrics and Gynecology

## 2020-03-27 ENCOUNTER — Ambulatory Visit (INDEPENDENT_AMBULATORY_CARE_PROVIDER_SITE_OTHER): Payer: Commercial Managed Care - PPO | Admitting: Obstetrics and Gynecology

## 2020-03-27 ENCOUNTER — Encounter: Payer: Self-pay | Admitting: Obstetrics and Gynecology

## 2020-03-27 VITALS — BP 130/77 | HR 103 | Ht 64.0 in | Wt 236.0 lb

## 2020-03-27 DIAGNOSIS — Z124 Encounter for screening for malignant neoplasm of cervix: Secondary | ICD-10-CM

## 2020-03-27 DIAGNOSIS — Z1239 Encounter for other screening for malignant neoplasm of breast: Secondary | ICD-10-CM | POA: Diagnosis not present

## 2020-03-27 DIAGNOSIS — Z01419 Encounter for gynecological examination (general) (routine) without abnormal findings: Secondary | ICD-10-CM | POA: Diagnosis not present

## 2020-03-27 DIAGNOSIS — Z1231 Encounter for screening mammogram for malignant neoplasm of breast: Secondary | ICD-10-CM | POA: Diagnosis not present

## 2020-03-27 DIAGNOSIS — N390 Urinary tract infection, site not specified: Secondary | ICD-10-CM

## 2020-03-27 MED ORDER — TRIMETHOPRIM 100 MG PO TABS: 100.0000 mg | ORAL_TABLET | Freq: Every day | ORAL | 3 refills | Status: DC

## 2020-03-27 NOTE — Patient Instructions (Signed)
[facility] [address] [place] [address]  [facility] [place]  [address] [place] [address]  Phone: [phone]  

## 2020-03-27 NOTE — Progress Notes (Signed)
Gynecology Annual Exam   PCP: Patient, No Pcp Per  Chief Complaint:  Chief Complaint  Patient presents with  . Gynecologic Exam    Reoccuring UTI's    History of Present Illness: Patient is a 39 y.o. G3P3 presents for annual exam. The patient has no complaints today.   LMP: No LMP recorded. Patient has had an ablation. Amenorrhea s/p ablation  The patient is sexually active. The patient does perform self breast exams.  There is no notable family history of breast or ovarian cancer in her family.  The patient wears seatbelts: yes.   The patient has regular exercise: not asked.    The patient denies current symptoms of depression.    Diagnosed with Sjogren's syndrome following pericarditis.    Review of Systems: Review of Systems  Constitutional: Negative for chills and fever.  HENT: Negative for congestion.   Respiratory: Negative for cough and shortness of breath.   Cardiovascular: Negative for chest pain and palpitations.  Gastrointestinal: Negative for abdominal pain, constipation, diarrhea, heartburn, nausea and vomiting.  Genitourinary: Negative for dysuria, frequency and urgency.  Skin: Negative for itching and rash.  Neurological: Negative for dizziness and headaches.  Endo/Heme/Allergies: Negative for polydipsia.  Psychiatric/Behavioral: Negative for depression.    Past Medical History:  There are no problems to display for this patient.   Past Surgical History:  Past Surgical History:  Procedure Laterality Date  . APPENDECTOMY    . CESAREAN SECTION    . CHOLECYSTECTOMY    . ENDOMETRIAL ABLATION      Gynecologic History:  No LMP recorded. Patient has had an ablation.  Obstetric History: G3P3  Family History:  Family History  Problem Relation Age of Onset  . Healthy Mother   . Diabetes Father     Social History:  Social History   Socioeconomic History  . Marital status: Single    Spouse name: Not on file  . Number of children: Not on  file  . Years of education: Not on file  . Highest education level: Not on file  Occupational History  . Not on file  Tobacco Use  . Smoking status: Former Smoker    Quit date: 11/01/2017    Years since quitting: 2.4  . Smokeless tobacco: Never Used  Substance and Sexual Activity  . Alcohol use: Not Currently  . Drug use: No  . Sexual activity: Yes    Birth control/protection: None  Other Topics Concern  . Not on file  Social History Narrative  . Not on file   Social Determinants of Health   Financial Resource Strain:   . Difficulty of Paying Living Expenses:   Food Insecurity:   . Worried About Charity fundraiser in the Last Year:   . Arboriculturist in the Last Year:   Transportation Needs:   . Film/video editor (Medical):   Marland Kitchen Lack of Transportation (Non-Medical):   Physical Activity:   . Days of Exercise per Week:   . Minutes of Exercise per Session:   Stress:   . Feeling of Stress :   Social Connections:   . Frequency of Communication with Friends and Family:   . Frequency of Social Gatherings with Friends and Family:   . Attends Religious Services:   . Active Member of Clubs or Organizations:   . Attends Archivist Meetings:   Marland Kitchen Marital Status:   Intimate Partner Violence:   . Fear of Current or Ex-Partner:   .  Emotionally Abused:   Marland Kitchen Physically Abused:   . Sexually Abused:     Allergies:  No Known Allergies  Medications: Prior to Admission medications   Medication Sig Start Date End Date Taking? Authorizing Provider  buPROPion (WELLBUTRIN XL) 300 MG 24 hr tablet Take 300 mg by mouth daily.   Yes [provider]    Physical Exam Vitals: Blood pressure 130/77, pulse (!) 103, height 5\' 4"  (1.626 m), weight 236 lb (107 kg).  General: NAD HEENT: normocephalic, anicteric Thyroid: no enlargement, no palpable nodules Pulmonary: No increased work of breathing, CTAB Cardiovascular: RRR, distal pulses 2+ Breast: Breast symmetrical,  no tenderness, no palpable nodules or masses, no skin or nipple retraction present, no nipple discharge.  No axillary or supraclavicular lymphadenopathy. Abdomen: NABS, soft, non-tender, non-distended.  Umbilicus without lesions.  No hepatomegaly, splenomegaly or masses palpable. No evidence of hernia  Genitourinary:  External: Normal external female genitalia.  Normal urethral meatus, normal Bartholin's and Skene's glands.    Vagina: Normal vaginal mucosa, no evidence of prolapse.    Cervix: Grossly normal in appearance, no bleeding  Uterus: Non-enlarged, mobile, normal contour.  No CMT  Adnexa: ovaries non-enlarged, no adnexal masses  Rectal: deferred  Lymphatic: no evidence of inguinal lymphadenopathy Extremities: no edema, erythema, or tenderness Neurologic: Grossly intact Psychiatric: mood appropriate, affect full  Female chaperone present for pelvic and breast  portions of the physical exam    Assessment: 39 y.o. G3P3 routine annual exam  Plan: Problem List Items Addressed This Visit    None    Visit Diagnoses    Encounter for gynecological examination without abnormal finding    -  Primary   Screening for malignant neoplasm of cervix       Relevant Orders   Cytology - PAP   Encounter for breast cancer screening other than mammogram       Encounter for screening mammogram for breast cancer       Relevant Orders   MM 3D SCREEN BREAST BILATERAL   Recurrent UTI       Relevant Medications   trimethoprim (TRIMPEX) 100 MG tablet      1) STI screening  was notoffered and therefore not obtained  2)  ASCCP guidelines and rational discussed.  Patient opts for every 3 years screening interval  3) Status post endometrial ablation without menstrual complaints.    4) Routine healthcare maintenance including cholesterol, diabetes screening discussed managed by PCP  - screening mammogram ordered  5) Recurrent UTI - Definition of recurrent urinary tract infections is 2 culture  proven episodes in 6 months, or 3 culture proven episodes in 1 year with individual episodes separated by at least two weeks or documentation of differing urinary tract pathogen.  Relapse of urinary tract infection is symptoms within 2 weeks of adequate treatment, or recurrence of symptoms within 2 weeks and documentation of the same causative urinary tract pathogen.      41 Society Best-Practice Statement: Recurrent Urinary Tract Infection in Adult Women" Female Pelvic Medicine & Reconstructive Surgery Volume 24, Number 5, September/October 2018  - restart trimethoprim once daily for suppression  6) Return in about 1 year (around 03/27/2021) for annual.   05/27/2021, MD, [facility], [facility] 03/27/2020, 3:06 PM

## 2020-04-02 LAB — CYTOLOGY - PAP
Adequacy: ABSENT
Comment: NEGATIVE
Diagnosis: NEGATIVE
High risk HPV: NEGATIVE

## 2020-06-18 ENCOUNTER — Other Ambulatory Visit: Payer: Self-pay

## 2020-06-18 ENCOUNTER — Encounter: Payer: Self-pay | Admitting: Emergency Medicine

## 2020-06-18 ENCOUNTER — Ambulatory Visit
Admission: EM | Admit: 2020-06-18 | Discharge: 2020-06-18 | Disposition: A | Discharge status: Home / Self Care | Payer: Commercial Managed Care - PPO | Attending: Family Medicine | Admitting: Family Medicine

## 2020-06-18 ENCOUNTER — Ambulatory Visit (INDEPENDENT_AMBULATORY_CARE_PROVIDER_SITE_OTHER): Payer: Commercial Managed Care - PPO

## 2020-06-18 DIAGNOSIS — S93402A Sprain of unspecified ligament of left ankle, initial encounter: Secondary | ICD-10-CM

## 2020-06-18 DIAGNOSIS — S8262XA Displaced fracture of lateral malleolus of left fibula, initial encounter for closed fracture: Secondary | ICD-10-CM

## 2020-06-18 MED ORDER — KETOROLAC TROMETHAMINE 10 MG PO TABS: 10.0000 mg | ORAL_TABLET | Freq: Four times a day (QID) | ORAL | 0 refills | Status: DC | PRN

## 2020-06-18 NOTE — ED Provider Notes (Signed)
[facility]    CSN: [id number] Arrival date & time: 06/18/20  0843      History   Chief Complaint Chief Complaint  Patient presents with  . Ankle Injury    left   HPI   39 year old female presents with an ankle injury.  Patient states that she twisted her ankle after stepping off a curb this morning.  She has severe swelling and bruising to the lateral aspect of the left ankle.  She reports difficulty ambulating.  Rates her pain is 8/10 in severity.  No relieving factors.  No other reported injuries.  No other complaints or concerns at this time.  Past Medical History:  Diagnosis Date  . Lupus (HCC) 2021  . Pericarditis 10/2019  . Recurrent UTI   . Sjogren's disease (HCC) 2021   Past Surgical History:  Procedure Laterality Date  . APPENDECTOMY    . CESAREAN SECTION    . CHOLECYSTECTOMY    . ENDOMETRIAL ABLATION      OB History    Gravida  3   Para  3   Term      Preterm      AB      Living  3     SAB      TAB      Ectopic      Multiple      Live Births               Home Medications    Prior to Admission medications   Medication Sig Start Date End Date Taking? Authorizing Provider  buPROPion (WELLBUTRIN XL) 300 MG 24 hr tablet Take 300 mg by mouth daily.   Yes [provider]  colchicine 0.6 MG tablet Take 0.6 mg by mouth 2 (two) times daily. 02/25/20  Yes [provider]  cyclobenzaprine (FLEXERIL) 10 MG tablet Take 10 mg by mouth at bedtime. 03/24/20  Yes [provider]  hydroxychloroquine (PLAQUENIL) 200 MG tablet Take 200 mg by mouth 2 (two) times daily. 03/04/20  Yes [provider]  trimethoprim (TRIMPEX) 100 MG tablet Take 1 tablet (100 mg total) by mouth daily. 03/27/20  Yes Vena Austria, MD  ketorolac (TORADOL) 10 MG tablet Take 1 tablet (10 mg total) by mouth every 6 (six) hours as needed for moderate pain or severe pain. 06/18/20   Tommie Sams, DO    Family History Family History   Problem Relation Age of Onset  . Healthy Mother   . Diabetes Father     Social History Social History   Tobacco Use  . Smoking status: Former Smoker    Quit date: 11/01/2017    Years since quitting: 2.6  . Smokeless tobacco: Never Used  Vaping Use  . Vaping Use: Former  Substance Use Topics  . Alcohol use: Not Currently  . Drug use: No     Allergies   Patient has no known allergies.   Review of Systems Review of Systems  Constitutional: Negative.   Musculoskeletal:       Ankle injury, pain, swelling.   Physical Exam Triage Vital Signs ED Triage Vitals  Enc Vitals Group     BP 06/18/20 0906 (!) 135/100     Pulse Rate 06/18/20 0906 100     Resp 06/18/20 0906 18     Temp 06/18/20 0906 98.3 F (36.8 C)     Temp Source 06/18/20 0906 Oral     SpO2 06/18/20 0906 98 %  Weight 06/18/20 0904 (!) 230 lb (104.3 kg)     Height 06/18/20 0904 5\' 5"  (1.651 m)     Head Circumference --      Peak Flow --      Pain Score 06/18/20 0904 8     Pain Loc --      Pain Edu? --      Excl. in GC? --    Updated Vital Signs BP (!) 135/100 (BP Location: Right Arm)   Pulse 100   Temp 98.3 F (36.8 C) (Oral)   Resp 18   Ht 5\' 5"  (1.651 m)   Wt (!) 104.3 kg   SpO2 98%   BMI 38.27 kg/m   Visual Acuity Right Eye Distance:   Left Eye Distance:   Bilateral Distance:    Right Eye Near:   Left Eye Near:    Bilateral Near:     Physical Exam Constitutional:      General: She is not in acute distress.    Appearance: Normal appearance. She is obese.  HENT:     Head: Normocephalic and atraumatic.  Eyes:     General:        Right eye: No discharge.        Left eye: No discharge.     Conjunctiva/sclera: Conjunctivae normal.  Cardiovascular:     Rate and Rhythm: Normal rate and regular rhythm.  Pulmonary:     Effort: Pulmonary effort is normal. No respiratory distress.  Musculoskeletal:     Comments: Left ankle -severe swelling around the lateral malleolus.  Exquisitely  tender palpation.  Bruising noted.  Neurological:     Mental Status: She is alert.    UC Treatments / Results  Labs (all labs ordered are listed, but only abnormal results are displayed) Labs Reviewed - No data to display  EKG   Radiology DG Ankle Complete Left  Result Date: 06/18/2020 CLINICAL DATA:  Fall.  Inversion injury. EXAM: LEFT ANKLE COMPLETE - 3+ VIEW COMPARISON:  No recent. FINDINGS: Diffuse soft tissue swelling, particularly prominent over the lateral malleolus. Avulsion fracture noted of the distal tip of the lateral malleolus. Medial malleolus is intact. Calcaneal spurring. IMPRESSION: Avulsion fracture of the distal tip of the lateral malleolus. Electronically Signed   By: 06/20/2020  Register   On: 06/18/2020 09:47    Procedures Procedures (including critical care time)  Medications Ordered in UC Medications - No data to display  Initial Impression / Assessment and Plan / UC Course  I have reviewed the triage vital signs and the nursing notes.  Pertinent labs & imaging results that were available during my care of the patient were reviewed by me and considered in my medical decision making (see chart for details).    39 year old female presents with left ankle sprain which has resulted in an avulsion fracture of the lateral malleolus.  X-ray obtained today and independently reviewed by me.  Revealed avulsion fracture of the tip of the left lateral malleolus.  Toradol as needed for pain.  Placed in cam walker.  Work note given.  Advised to follow-up with orthopedics.  Final Clinical Impressions(s) / UC Diagnoses   Final diagnoses:  Sprain of left ankle, unspecified ligament, initial encounter  Closed avulsion fracture of lateral malleolus of left fibula, initial encounter     Discharge Instructions     Rest, ice, elevation.   Medication as directed.  Follow up with ortho - Please call [facility] clinic Orthopedics [phone]) OR [facility] [phone])  for an  appt.  Take care  Dr. Adriana Simas     ED Prescriptions    Medication Sig Dispense Auth. Provider   ketorolac (TORADOL) 10 MG tablet Take 1 tablet (10 mg total) by mouth every 6 (six) hours as needed for moderate pain or severe pain. 20 tablet Tommie Sams, DO     PDMP not reviewed this encounter.   Tommie Sams, DO 06/18/20 1026

## 2020-06-18 NOTE — ED Triage Notes (Signed)
Patient states she fell off of a curb injuring her left ankle. She is c/o pain with walking.

## 2020-06-18 NOTE — Discharge Instructions (Signed)
Rest, ice, elevation.   Medication as directed.  Follow up with ortho - Please call [facility] clinic Orthopedics [phone]) OR [facility] [phone]) for an appt.  Take care  Dr. Adriana Simas

## 2021-02-17 ENCOUNTER — Other Ambulatory Visit: Payer: Self-pay | Admitting: Obstetrics and Gynecology

## 2021-02-18 NOTE — Telephone Encounter (Signed)
Please advise, patient not seen since 11/28/18.

## 2021-02-18 NOTE — Telephone Encounter (Signed)
Need follow up for refill

## 2021-03-08 ENCOUNTER — Other Ambulatory Visit: Payer: Self-pay | Admitting: Obstetrics and Gynecology

## 2021-03-17 NOTE — Telephone Encounter (Signed)
Need annual for additional refills

## 2021-03-17 NOTE — Telephone Encounter (Signed)
Called and left voicemail for patient to call back to be scheduled. 

## 2021-09-10 ENCOUNTER — Encounter: Payer: Self-pay | Admitting: Obstetrics and Gynecology

## 2021-09-10 ENCOUNTER — Other Ambulatory Visit: Payer: Self-pay

## 2021-09-10 ENCOUNTER — Ambulatory Visit (INDEPENDENT_AMBULATORY_CARE_PROVIDER_SITE_OTHER): Payer: Commercial Managed Care - PPO | Admitting: Obstetrics and Gynecology

## 2021-09-10 VITALS — BP 120/90 | Ht 65.0 in | Wt 233.0 lb

## 2021-09-10 DIAGNOSIS — N3001 Acute cystitis with hematuria: Secondary | ICD-10-CM | POA: Diagnosis not present

## 2021-09-10 LAB — POCT URINALYSIS DIPSTICK
Bilirubin, UA: NEGATIVE
Glucose, UA: NEGATIVE
Ketones, UA: NEGATIVE
Nitrite, UA: NEGATIVE
Protein, UA: POSITIVE — AB
Spec Grav, UA: 1.02 (ref 1.010–1.025)
pH, UA: 6 (ref 5.0–8.0)

## 2021-09-10 MED ORDER — TRIMETHOPRIM 100 MG PO TABS: 100.0000 mg | ORAL_TABLET | Freq: Every day | ORAL | 0 refills | Status: DC

## 2021-09-10 MED ORDER — NITROFURANTOIN MONOHYD MACRO 100 MG PO CAPS
100.0000 mg | ORAL_CAPSULE | Freq: Two times a day (BID) | ORAL | 0 refills | Status: AC
Start: 2021-09-10 — End: 2021-09-15

## 2021-09-10 NOTE — Progress Notes (Signed)
Patient, No Pcp Per (Inactive)   Chief Complaint  Patient presents with   Urinary Tract Infection    Urinary frequency and burning x 2 days    HPI:      [name] is a 40 y.o. G3P3 whose LMP was No LMP recorded. Patient has had an ablation., presents today for UTI sx of urinary frequency/urgency/dysuria for 2 days, sx getting worse. No recent abx use. No LBP, pelvic pain, fevers, vag sx. Hx of recurrent UTI in past. Dr. Bonney Aid gave pt trimethoprim 100 mg to take daily as preventive. Rx ran our earlier this yr since pt not current on annual.   E. coli on C&S in past; treated with macrobid or bactrim.   Past Medical History:  Diagnosis Date   Lupus (HCC) 2021   Pericarditis 10/2019   Recurrent UTI    Sjogren's disease (HCC) 2021    Past Surgical History:  Procedure Laterality Date   APPENDECTOMY     CESAREAN SECTION     CHOLECYSTECTOMY     ENDOMETRIAL ABLATION      Family History  Problem Relation Age of Onset   Healthy Mother    Diabetes Father     Social History   Socioeconomic History   Marital status: Single    Spouse name: Not on file   Number of children: Not on file   Years of education: Not on file   Highest education level: Not on file  Occupational History   Not on file  Tobacco Use   Smoking status: Former    Types: Cigarettes    Quit date: 11/01/2017    Years since quitting: 3.8   Smokeless tobacco: Never  Vaping Use   Vaping Use: Former  Substance and Sexual Activity   Alcohol use: Not Currently   Drug use: No   Sexual activity: Yes    Birth control/protection: None  Other Topics Concern   Not on file  Social History Narrative   Not on file   Social Determinants of Health   Financial Resource Strain: Not on file  Food Insecurity: Not on file  Transportation Needs: Not on file  Physical Activity: Not on file  Stress: Not on file  Social Connections: Not on file  Intimate Partner Violence: Not on file    Outpatient  Medications Prior to Visit  Medication Sig Dispense Refill   amitriptyline (ELAVIL) 25 MG tablet Take 25 mg by mouth at bedtime.     buPROPion (WELLBUTRIN XL) 300 MG 24 hr tablet Take 300 mg by mouth daily.     colchicine 0.6 MG tablet Take 0.6 mg by mouth 2 (two) times daily.     cyclobenzaprine (FLEXERIL) 10 MG tablet Take 10 mg by mouth at bedtime.     gabapentin (NEURONTIN) 300 MG capsule Take 300 mg by mouth at bedtime.     hydroxychloroquine (PLAQUENIL) 200 MG tablet Take 200 mg by mouth 2 (two) times daily.     ketorolac (TORADOL) 10 MG tablet Take 1 tablet (10 mg total) by mouth every 6 (six) hours as needed for moderate pain or severe pain. 20 tablet 0   trimethoprim (TRIMPEX) 100 MG tablet TAKE 1 TABLET BY MOUTH EVERY DAY (Patient not taking: Reported on 09/10/2021) 90 tablet 0   No facility-administered medications prior to visit.      ROS:  Review of Systems  Constitutional:  Negative for fever.  Gastrointestinal:  Negative for blood in stool, constipation, diarrhea, nausea and  vomiting.  Genitourinary:  Positive for dysuria, frequency and urgency. Negative for dyspareunia, flank pain, hematuria, vaginal bleeding, vaginal discharge and vaginal pain.  Musculoskeletal:  Negative for back pain.  Skin:  Negative for rash.  BREAST: No symptoms   OBJECTIVE:   Vitals:  BP 120/90   Ht 5\' 5"  (1.651 m)   Wt 233 lb (105.7 kg)   BMI 38.77 kg/m   Physical Exam Vitals reviewed.  Constitutional:      Appearance: She is well-developed. She is not ill-appearing or toxic-appearing.  Pulmonary:     Effort: Pulmonary effort is normal.  Abdominal:     Tenderness: There is no right CVA tenderness or left CVA tenderness.  Musculoskeletal:        General: Normal range of motion.     Cervical back: Normal range of motion.  Neurological:     General: No focal deficit present.     Mental Status: She is alert and oriented to person, place, and time.     Cranial Nerves: No cranial  nerve deficit.  Psychiatric:        Behavior: Behavior normal.        Thought Content: Thought content normal.        Judgment: Judgment normal.    Results: Results for orders placed or performed in visit on 09/10/21 (from the past 24 hour(s))  POCT Urinalysis Dipstick     Status: Abnormal   Collection Time: 09/10/21  4:47 PM  Result Value Ref Range   Color, UA yellow    Clarity, UA clear    Glucose, UA Negative Negative   Bilirubin, UA neg    Ketones, UA neg    Spec Grav, UA 1.020 1.010 - 1.025   Blood, UA mod    pH, UA 6.0 5.0 - 8.0   Protein, UA Positive (A) Negative   Urobilinogen, UA     Nitrite, UA neg    Leukocytes, UA Moderate (2+) (A) Negative   Appearance     Odor       Assessment/Plan: Acute cystitis with hematuria - Plan: Urine Culture, POCT Urinalysis Dipstick, nitrofurantoin, macrocrystal-monohydrate, (MACROBID) 100 MG capsule, trimethoprim (TRIMPEX) 100 MG tablet; pos sx and UA. Rx macrobid, check C&S. Rx RF trimpex daily as preventive after completes macrobid. F/u prn.    Meds ordered this encounter  Medications   nitrofurantoin, macrocrystal-monohydrate, (MACROBID) 100 MG capsule    Sig: Take 1 capsule (100 mg total) by mouth 2 (two) times daily for 5 days.    Dispense:  10 capsule    Refill:  0    Order Specific Question:   Supervising Provider    Answer:   09/12/21 Nadara Mustard   trimethoprim (TRIMPEX) 100 MG tablet    Sig: Take 1 tablet (100 mg total) by mouth daily.    Dispense:  90 tablet    Refill:  0    Order Specific Question:   Supervising Provider    Answer:   [[id number]] Nadara Mustard      Return if symptoms worsen or fail to improve.  Ulises Wolfinger B. Zaineb Nowaczyk, PA-C 09/10/2021 4:49 PM

## 2021-09-17 LAB — URINE CULTURE

## 2021-09-18 NOTE — Progress Notes (Signed)
Can you pls call this pt on Fri. I LM yesterday re: C&S results (see my results note). If still sx, need to change abx. If feeling better, pt can then resume preventive abx tx. Thx

## 2021-12-07 IMAGING — CR DG ANKLE COMPLETE 3+V*L*
3 series · 4 of 4 positions shown · non-contrast
Comparison: No recent.

CLINICAL DATA: Fall.  Inversion injury.

EXAM:
LEFT ANKLE COMPLETE - 3+ VIEW

[ankle ap]
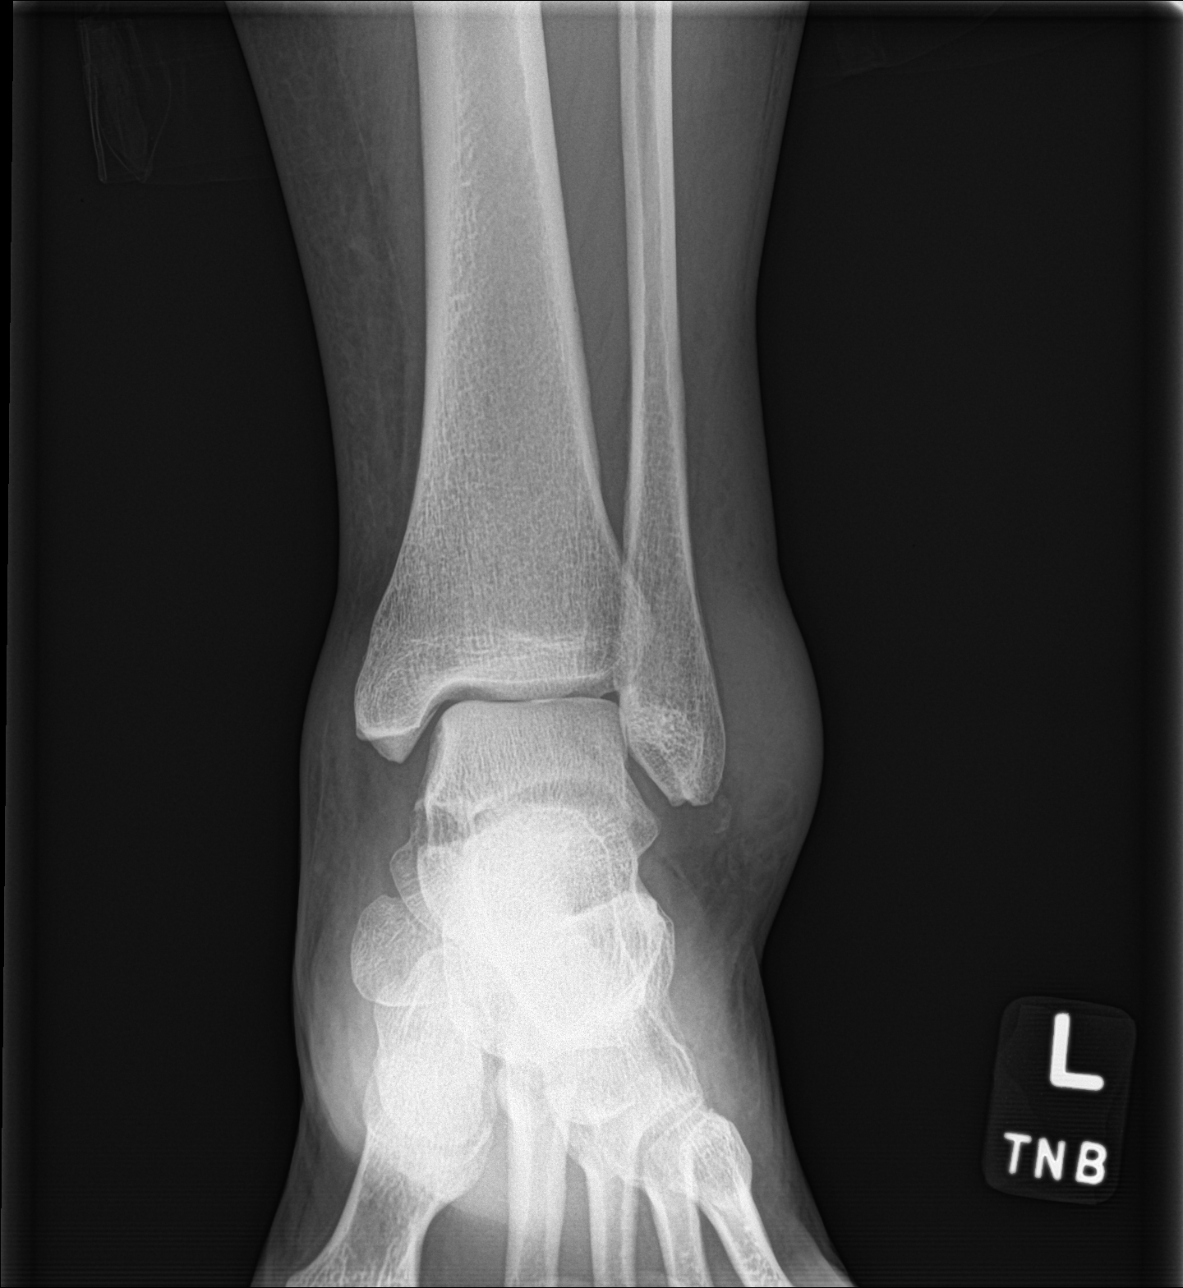

[Series 2: ankle obl · 0.14mm/px · 2 of 2 slices shown]
[im 1/2]
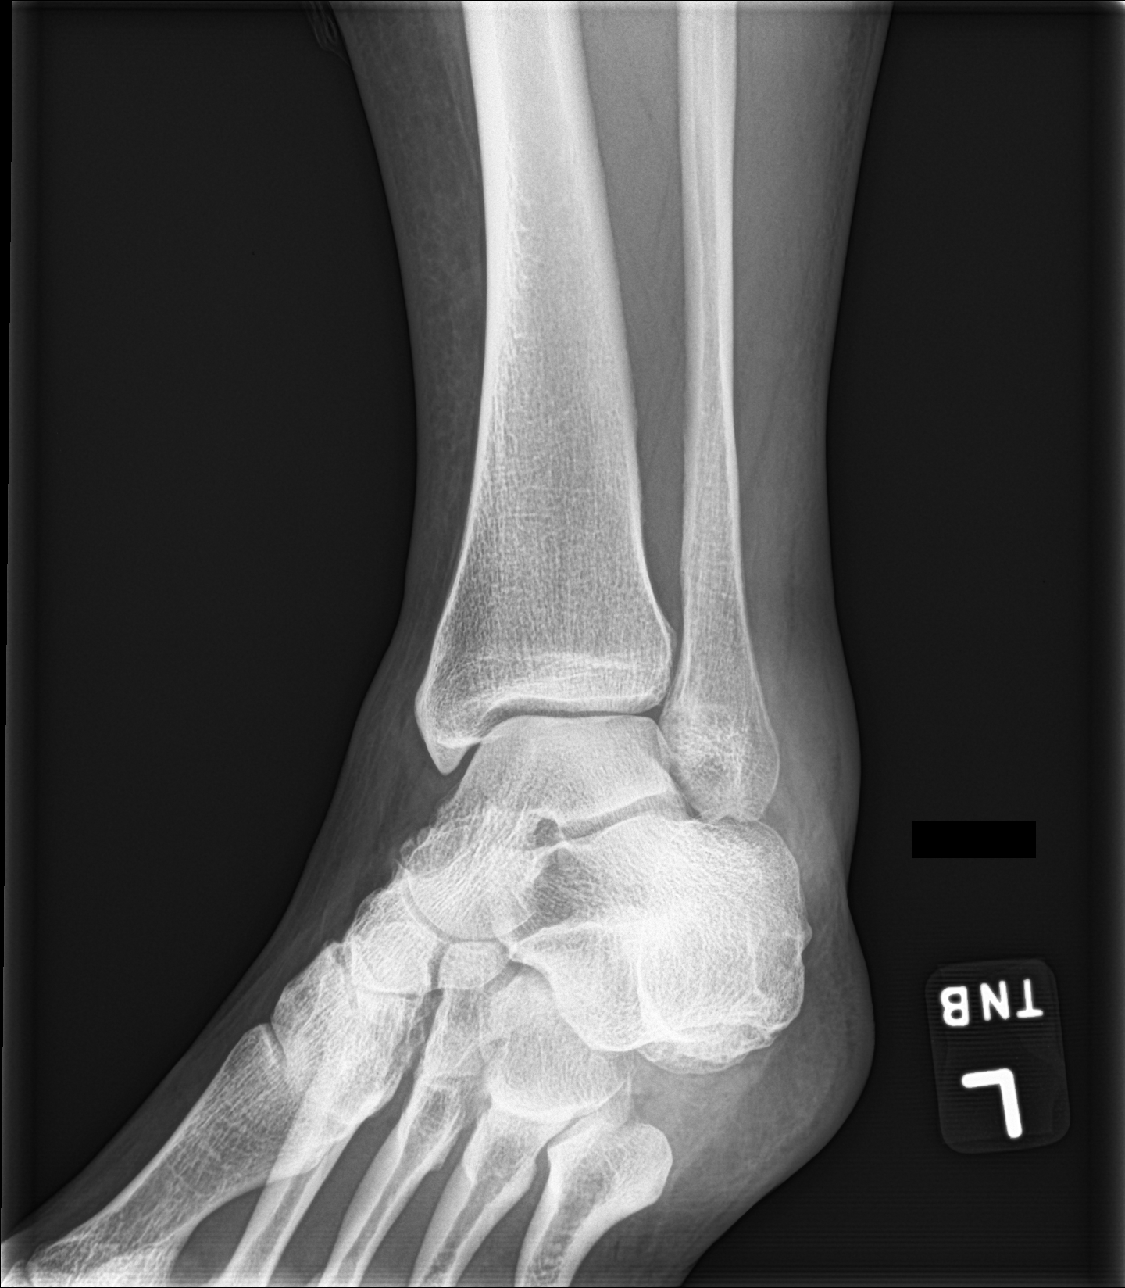
[im 2/2]
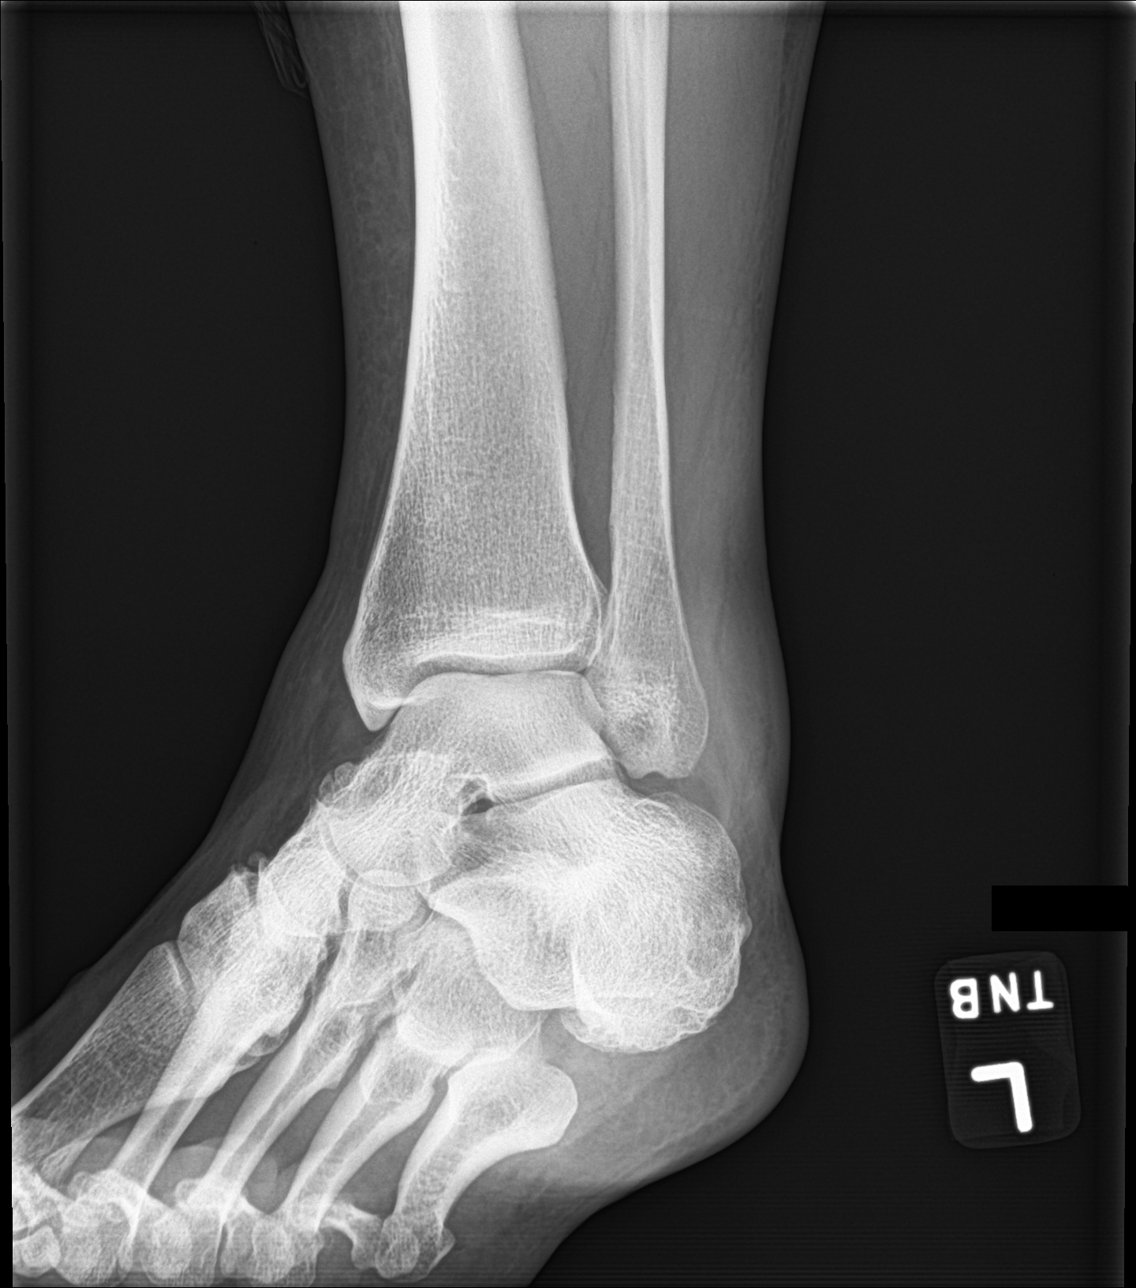

[ankle lat]
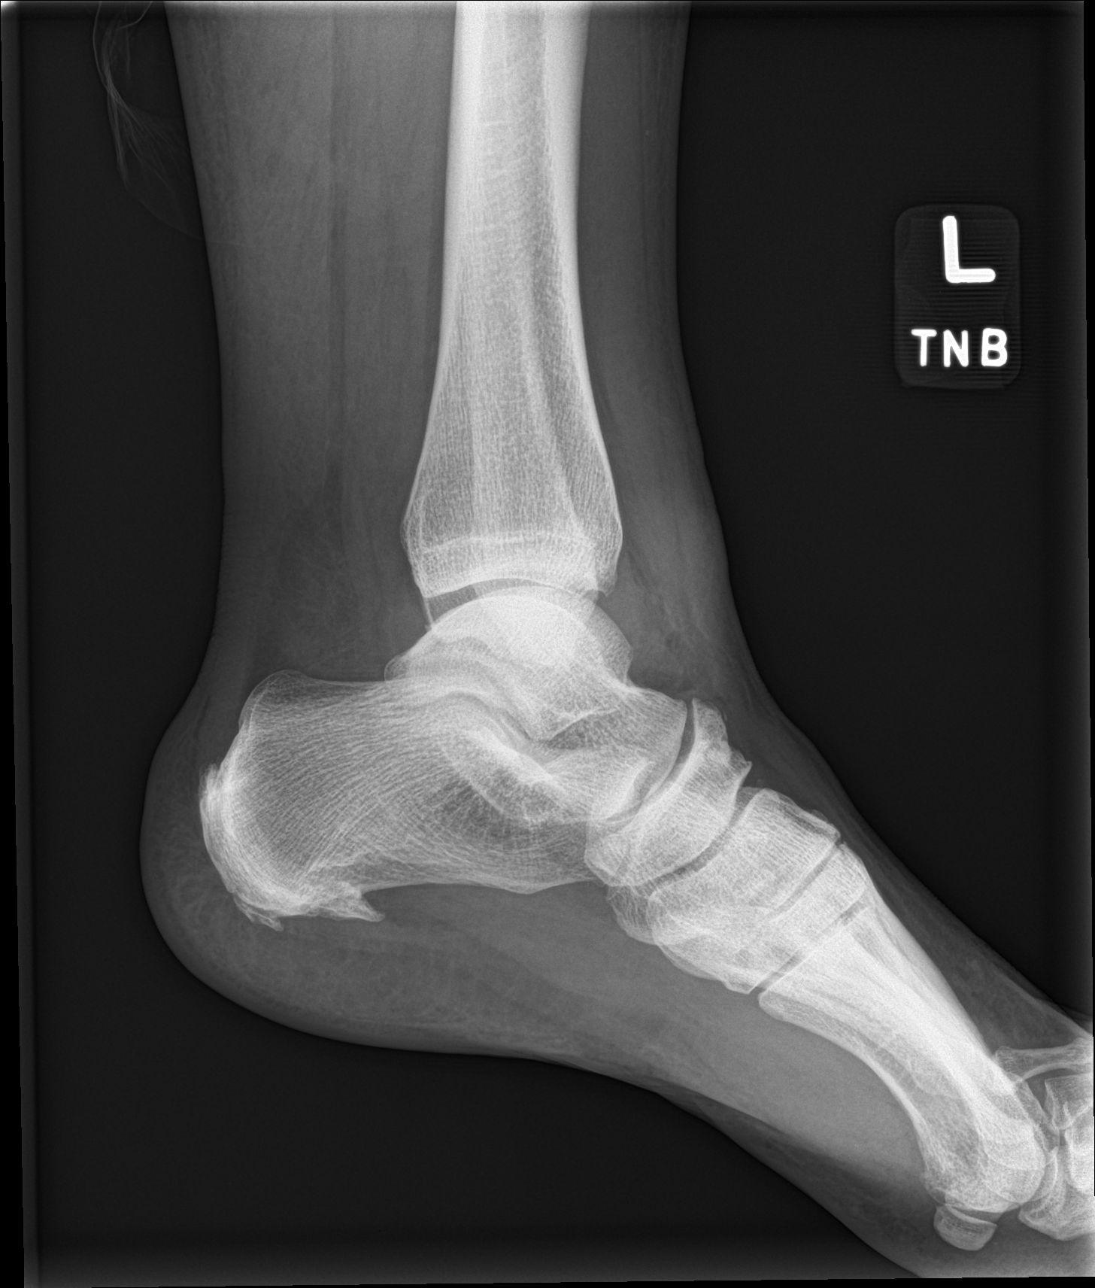

[4 of 4 positions shown; findings below may reference images not displayed]

FINDINGS: Diffuse soft tissue swelling, particularly prominent over the
lateral malleolus. Avulsion fracture noted of the distal tip of the
lateral malleolus. Medial malleolus is intact. Calcaneal spurring.
IMPRESSION: Avulsion fracture of the distal tip of the lateral malleolus.

## 2022-01-15 ENCOUNTER — Ambulatory Visit: Payer: Commercial Managed Care - PPO | Admitting: Obstetrics and Gynecology

## 2022-04-28 ENCOUNTER — Other Ambulatory Visit: Payer: Self-pay | Admitting: Obstetrics and Gynecology

## 2022-04-28 DIAGNOSIS — N3001 Acute cystitis with hematuria: Secondary | ICD-10-CM

## 2022-06-02 ENCOUNTER — Encounter: Payer: Self-pay | Admitting: Obstetrics and Gynecology

## 2022-06-04 ENCOUNTER — Other Ambulatory Visit: Payer: Self-pay | Admitting: Obstetrics and Gynecology

## 2022-06-04 DIAGNOSIS — N3001 Acute cystitis with hematuria: Secondary | ICD-10-CM

## 2022-06-04 MED ORDER — TRIMETHOPRIM 100 MG PO TABS: 100.0000 mg | ORAL_TABLET | Freq: Every day | ORAL | 0 refills | Status: DC

## 2022-06-04 NOTE — Progress Notes (Signed)
Rx RF trimethoprim for UTIs

## 2022-09-18 ENCOUNTER — Other Ambulatory Visit: Payer: Self-pay | Admitting: Obstetrics and Gynecology

## 2022-09-18 DIAGNOSIS — N3001 Acute cystitis with hematuria: Secondary | ICD-10-CM

## 2022-09-18 NOTE — Telephone Encounter (Signed)
Hey she hasnt been in since 08/2021 do you want to approve this or get her in for an appointment looks like a refill was just sent in on 08/28/2022

## 2022-10-07 ENCOUNTER — Other Ambulatory Visit: Payer: Self-pay | Admitting: Obstetrics and Gynecology

## 2022-10-07 DIAGNOSIS — N3001 Acute cystitis with hematuria: Secondary | ICD-10-CM

## 2022-10-07 MED ORDER — TRIMETHOPRIM 100 MG PO TABS: 100.0000 mg | ORAL_TABLET | Freq: Every day | ORAL | 0 refills | Status: DC

## 2022-10-13 ENCOUNTER — Telehealth: Payer: Self-pay | Admitting: Obstetrics and Gynecology

## 2022-10-13 NOTE — Telephone Encounter (Signed)
Reached out to pt to reschedule 11/24/22 appt with [facility] (schedule change).  Could not leave a message.  [phone] is not in service, the mobile number dropped.  Will send a MyChart mess to pt.

## 2022-10-20 NOTE — Telephone Encounter (Signed)
Pt is scheduled on 11/30/2022 at 8:35.

## 2022-10-30 DIAGNOSIS — H10023 Other mucopurulent conjunctivitis, bilateral: Secondary | ICD-10-CM | POA: Diagnosis not present

## 2022-10-30 DIAGNOSIS — Z6837 Body mass index (BMI) 37.0-37.9, adult: Secondary | ICD-10-CM | POA: Diagnosis not present

## 2022-11-01 ENCOUNTER — Encounter: Payer: Self-pay | Admitting: Emergency Medicine

## 2022-11-01 ENCOUNTER — Ambulatory Visit
Admission: EM | Admit: 2022-11-01 | Discharge: 2022-11-01 | Disposition: A | Discharge status: Home / Self Care | Payer: 59 | Attending: Family Medicine | Admitting: Family Medicine

## 2022-11-01 DIAGNOSIS — K1120 Sialoadenitis, unspecified: Secondary | ICD-10-CM | POA: Diagnosis not present

## 2022-11-01 DIAGNOSIS — H109 Unspecified conjunctivitis: Secondary | ICD-10-CM | POA: Diagnosis not present

## 2022-11-01 MED ORDER — MOXIFLOXACIN HCL 0.5 % OP SOLN: 1.0000 [drp] | Freq: Three times a day (TID) | OPHTHALMIC | 0 refills | Status: AC

## 2022-11-01 MED ORDER — AMOXICILLIN-POT CLAVULANATE 875-125 MG PO TABS: 1.0000 | ORAL_TABLET | Freq: Two times a day (BID) | ORAL | 0 refills | Status: DC

## 2022-11-01 NOTE — ED Provider Notes (Signed)
[facility]    CSN: [id number] Arrival date & time: 11/01/22  1007      History   Chief Complaint Chief Complaint  Patient presents with   Facial Swelling    Left side    HPI 41 year old female presents for evaluation of the above.  Patient states that she has been sick for the past week.  Symptoms worsened over the past 3 days.  She recently went to the [facility] and was prescribed eyedrops for conjunctivitis.  She has bilateral eye redness, irritation, and discharge.  She has underlying Sjogren's syndrome and suffers from dry eyes.  She has also had ongoing sinus pressure and congestion.  No fever.  Last night she developed left-sided facial swelling.  She has never had anything like this before.  No relieving factors.  No other complaints.  Past Medical History:  Diagnosis Date   Lupus (HCC) 2021   Pericarditis 10/2019   Recurrent UTI    Sjogren's disease (HCC) 2021   Past Surgical History:  Procedure Laterality Date   APPENDECTOMY     CESAREAN SECTION     CHOLECYSTECTOMY     ENDOMETRIAL ABLATION     OB History     Gravida  3   Para  3   Term      Preterm      AB      Living  3      SAB      IAB      Ectopic      Multiple      Live Births             Home Medications    Prior to Admission medications   Medication Sig Start Date End Date Taking? Authorizing Provider  amitriptyline (ELAVIL) 25 MG tablet Take 25 mg by mouth at bedtime. 07/25/21  Yes [provider]  buPROPion (WELLBUTRIN XL) 300 MG 24 hr tablet Take 300 mg by mouth daily.   Yes [provider]  colchicine 0.6 MG tablet Take 0.6 mg by mouth 2 (two) times daily. 02/25/20  Yes [provider]  cyclobenzaprine (FLEXERIL) 10 MG tablet Take 10 mg by mouth at bedtime. 03/24/20  Yes [provider]  gabapentin (NEURONTIN) 300 MG capsule Take 300 mg by mouth at bedtime. 07/25/21  Yes [provider]  hydroxychloroquine  (PLAQUENIL) 200 MG tablet Take 200 mg by mouth 2 (two) times daily. 03/04/20  Yes [provider]  trimethoprim (TRIMPEX) 100 MG tablet Take 1 tablet (100 mg total) by mouth daily. 10/07/22  Yes Copland, Ilona Sorrel, PA-C    Family History Family History  Problem Relation Age of Onset   Healthy Mother    Diabetes Father     Social History Social History   Tobacco Use   Smoking status: Former    Types: Cigarettes    Quit date: 11/01/2017    Years since quitting: 5.0   Smokeless tobacco: Never  Vaping Use   Vaping Use: Former  Substance Use Topics   Alcohol use: Not Currently   Drug use: No     Allergies   Patient has no known allergies.   Review of Systems Review of Systems Per HPI  Physical Exam Triage Vital Signs ED Triage Vitals  Enc Vitals Group     BP 11/01/22 1101 (!) 148/108     Pulse Rate 11/01/22 1101 93     Resp 11/01/22 1101 14     Temp 11/01/22 1101  98.5 F (36.9 C)     Temp Source 11/01/22 1101 Oral     SpO2 11/01/22 1101 99 %     Weight 11/01/22 1100 217 lb (98.4 kg)     Height 11/01/22 1100 5\' 5"  (1.651 m)     Head Circumference --      Peak Flow --      Pain Score 11/01/22 1059 7     Pain Loc --      Pain Edu? --      Excl. in GC? --    Updated Vital Signs BP (!) 148/108 (BP Location: Left Arm)   Pulse 93   Temp 98.5 F (36.9 C) (Oral)   Resp 14   Ht 5\' 5"  (1.651 m)   Wt 98.4 kg   SpO2 99%   BMI 36.11 kg/m   Visual Acuity Right Eye Distance:   Left Eye Distance:   Bilateral Distance:    Right Eye Near:   Left Eye Near:    Bilateral Near:     Physical Exam Constitutional:      Appearance: She is obese.  HENT:     Head: Normocephalic.     Comments: Left-sided facial swelling.    Mouth/Throat:     Pharynx: Oropharynx is clear.  Eyes:     Comments: Bilateral eye redness.  Evidence of discharge to the left eye.  Cardiovascular:     Rate and Rhythm: Normal rate and regular rhythm.  Pulmonary:     Effort: Pulmonary  effort is normal.     Breath sounds: Normal breath sounds. No wheezing, rhonchi or rales.  Neurological:     Mental Status: She is alert.    UC Treatments / Results  Labs (all labs ordered are listed, but only abnormal results are displayed) Labs Reviewed - No data to display  EKG   Radiology No results found.  Procedures Procedures (including critical care time)  Medications Ordered in UC Medications - No data to display  Initial Impression / Assessment and Plan / UC Course  I have reviewed the triage vital signs and the nursing notes.  Pertinent labs & imaging results that were available during my care of the patient were reviewed by me and considered in my medical decision making (see chart for details).    41 year old female presents with bilateral eye redness, sinus congestion, and swelling of the left side of the face.  Patient's swelling and tenderness is consistent with parotitis.  Also has bilateral conjunctivitis.  I do not suspect that she has mumps.  This is most likely a viral infection but her symptoms or not improving and bacterial parotitis is a possibility.  Placing on Vigamox and Augmentin.  Final Clinical Impressions(s) / UC Diagnoses   Final diagnoses:  None   Discharge Instructions   None    ED Prescriptions   None    PDMP not reviewed this encounter.   , 55 11/01/22 1132

## 2022-11-01 NOTE — ED Triage Notes (Signed)
Patient c/o pain and swelling on the left lower side of her face that started last night.  Patient also reports drainage and redness in both eyes that started 3 days ago.  Patient also reports sinus congestion and pressure.

## 2022-11-24 ENCOUNTER — Ambulatory Visit: Payer: Self-pay | Admitting: Obstetrics and Gynecology

## 2022-11-27 DIAGNOSIS — N390 Urinary tract infection, site not specified: Secondary | ICD-10-CM | POA: Insufficient documentation

## 2022-11-27 NOTE — Progress Notes (Deleted)
PCP:  Patient, No Pcp Per   No chief complaint on file.    HPI:      Jennifer Zamora is a 42 y.o. G3P3 whose LMP was No LMP recorded. Patient has had an ablation., presents today for her annual examination.  Her menses are {norm/abn:715}, lasting {number: 22536} days.  Dysmenorrhea {dysmen:716}. She {does:18564} have intermenstrual bleeding.  Sex activity: {sex active: 315163}.  Last Pap: 03/27/20 Results were: no abnormalities /neg HPV DNA  Hx of STDs: {STD hx:14358}  Last mammogram: {date:304500300}  Results were: {norm/abn:13465}  ??Never There is no FH of breast cancer. There is no FH of ovarian cancer. The patient {does:18564} do self-breast exams.  Tobacco use: {tob:20664} Alcohol use: {Alcohol:11675} No drug use.  Exercise: {exercise:31265}  She {does:18564} get adequate calcium and Vitamin D in her diet. Hx of recurrent UTIs, takes trimethoprim 100 mg dialy as preventive.  Past Medical History:  Diagnosis Date   Lupus ([place]) 2021   Pericarditis 10/2019   Recurrent UTI    Sjogren's disease ([place]) 2021     Past Surgical History:  Procedure Laterality Date   APPENDECTOMY     CESAREAN SECTION     CHOLECYSTECTOMY     ENDOMETRIAL ABLATION      Family History  Problem Relation Age of Onset   Healthy Mother    Diabetes Father     Social History   Socioeconomic History   Marital status: Single    Spouse name: Not on file   Number of children: Not on file   Years of education: Not on file   Highest education level: Not on file  Occupational History   Not on file  Tobacco Use   Smoking status: Former    Types: Cigarettes    Quit date: 11/01/2017    Years since quitting: 5.0   Smokeless tobacco: Never  Vaping Use   Vaping Use: Former  Substance and Sexual Activity   Alcohol use: Not Currently   Drug use: No   Sexual activity: Yes    Birth control/protection: None  Other Topics Concern   Not on file  Social History Narrative   Not on file    Social Determinants of Health   Financial Resource Strain: Not on file  Food Insecurity: Not on file  Transportation Needs: Not on file  Physical Activity: Not on file  Stress: Not on file  Social Connections: Not on file  Intimate Partner Violence: Not on file     Current Outpatient Medications:    amitriptyline (ELAVIL) 25 MG tablet, Take 25 mg by mouth at bedtime., Disp: , Rfl:    amoxicillin-clavulanate (AUGMENTIN) 875-125 MG tablet, Take 1 tablet by mouth every 12 (twelve) hours., Disp: 14 tablet, Rfl: 0   buPROPion (WELLBUTRIN XL) 300 MG 24 hr tablet, Take 300 mg by mouth daily., Disp: , Rfl:    colchicine 0.6 MG tablet, Take 0.6 mg by mouth 2 (two) times daily., Disp: , Rfl:    cyclobenzaprine (FLEXERIL) 10 MG tablet, Take 10 mg by mouth at bedtime., Disp: , Rfl:    gabapentin (NEURONTIN) 300 MG capsule, Take 300 mg by mouth at bedtime., Disp: , Rfl:    hydroxychloroquine (PLAQUENIL) 200 MG tablet, Take 200 mg by mouth 2 (two) times daily., Disp: , Rfl:    trimethoprim (TRIMPEX) 100 MG tablet, Take 1 tablet (100 mg total) by mouth daily., Disp: 90 tablet, Rfl: 0     ROS:  Review of Systems BREAST: No symptoms  Objective: There were no vitals taken for this visit.   OBGyn Exam  Results: No results found for this or any previous visit (from the past 24 hour(s)).  Assessment/Plan: No diagnosis found.  No orders of the defined types were placed in this encounter.            GYN counsel {counseling: 16159}     F/U  No follow-ups on file.  Pricella Gaugh B. Aries Kasa, PA-C 11/27/2022 10:01 PM

## 2022-11-30 ENCOUNTER — Ambulatory Visit: Payer: Self-pay | Admitting: Obstetrics and Gynecology

## 2022-11-30 DIAGNOSIS — Z01419 Encounter for gynecological examination (general) (routine) without abnormal findings: Secondary | ICD-10-CM

## 2022-11-30 DIAGNOSIS — N390 Urinary tract infection, site not specified: Secondary | ICD-10-CM

## 2022-11-30 DIAGNOSIS — Z1231 Encounter for screening mammogram for malignant neoplasm of breast: Secondary | ICD-10-CM

## 2023-01-04 NOTE — Progress Notes (Unsigned)
PCP:  Patient, No Pcp Per   No chief complaint on file.    HPI:      [name] is a 42 y.o. G3P3 whose LMP was No LMP recorded. Patient has had an ablation., presents today for her annual examination.  Her menses are {norm/abn:715}, lasting {number: 22536} days.  Dysmenorrhea {dysmen:716}. She {does:18564} have intermenstrual bleeding.  Sex activity: {sex active: 315163}.  Last Pap: 03/27/20 Results were: no abnormalities /neg HPV DNA  Hx of STDs: {STD hx:14358}  Hx of recurrent UTIs, takes trimethoprim 100 mg daily  Last mammogram: {date:304500300}  Results were: {norm/abn:13465} There is no FH of breast cancer. There is no FH of ovarian cancer. The patient {does:18564} do self-breast exams.  Tobacco use: {tob:20664} Alcohol use: {Alcohol:11675} No drug use.  Exercise: {exercise:31265}  She {does:18564} get adequate calcium and Vitamin D in her diet.  Patient Active Problem List   Diagnosis Date Noted   Recurrent UTI 11/27/2022    Past Surgical History:  Procedure Laterality Date   APPENDECTOMY     CESAREAN SECTION     CHOLECYSTECTOMY     ENDOMETRIAL ABLATION      Family History  Problem Relation Age of Onset   Healthy Mother    Diabetes Father     Social History   Socioeconomic History   Marital status: Single    Spouse name: Not on file   Number of children: Not on file   Years of education: Not on file   Highest education level: Not on file  Occupational History   Not on file  Tobacco Use   Smoking status: Former    Types: Cigarettes    Quit date: 11/01/2017    Years since quitting: 5.1   Smokeless tobacco: Never  Vaping Use   Vaping Use: Former  Substance and Sexual Activity   Alcohol use: Not Currently   Drug use: No   Sexual activity: Yes    Birth control/protection: None  Other Topics Concern   Not on file  Social History Narrative   Not on file   Social Determinants of Health   Financial Resource Strain: Not on file  Food  Insecurity: Not on file  Transportation Needs: Not on file  Physical Activity: Not on file  Stress: Not on file  Social Connections: Not on file  Intimate Partner Violence: Not on file     Current Outpatient Medications:    amitriptyline (ELAVIL) 25 MG tablet, Take 25 mg by mouth at bedtime., Disp: , Rfl:    amoxicillin-clavulanate (AUGMENTIN) 875-125 MG tablet, Take 1 tablet by mouth every 12 (twelve) hours., Disp: 14 tablet, Rfl: 0   buPROPion (WELLBUTRIN XL) 300 MG 24 hr tablet, Take 300 mg by mouth daily., Disp: , Rfl:    colchicine 0.6 MG tablet, Take 0.6 mg by mouth 2 (two) times daily., Disp: , Rfl:    cyclobenzaprine (FLEXERIL) 10 MG tablet, Take 10 mg by mouth at bedtime., Disp: , Rfl:    gabapentin (NEURONTIN) 300 MG capsule, Take 300 mg by mouth at bedtime., Disp: , Rfl:    hydroxychloroquine (PLAQUENIL) 200 MG tablet, Take 200 mg by mouth 2 (two) times daily., Disp: , Rfl:    trimethoprim (TRIMPEX) 100 MG tablet, Take 1 tablet (100 mg total) by mouth daily., Disp: 90 tablet, Rfl: 0     ROS:  Review of Systems BREAST: No symptoms   Objective: There were no vitals taken for this visit.   OBGyn Exam  Results: No results found for this or any previous visit (from the past 24 hour(s)).  Assessment/Plan: No diagnosis found.  No orders of the defined types were placed in this encounter.            GYN counsel {counseling: 16159}     F/U  No follow-ups on file.  Kam Kushnir B. Angie Hogg, PA-C 01/04/2023 7:09 PM

## 2023-01-05 ENCOUNTER — Ambulatory Visit (INDEPENDENT_AMBULATORY_CARE_PROVIDER_SITE_OTHER): Payer: BLUE CROSS/BLUE SHIELD | Admitting: Obstetrics and Gynecology

## 2023-01-05 ENCOUNTER — Encounter: Payer: Self-pay | Admitting: Obstetrics and Gynecology

## 2023-01-05 VITALS — BP 120/80 | Ht 64.0 in | Wt 225.0 lb

## 2023-01-05 DIAGNOSIS — Z87440 Personal history of urinary (tract) infections: Secondary | ICD-10-CM

## 2023-01-05 DIAGNOSIS — Z124 Encounter for screening for malignant neoplasm of cervix: Secondary | ICD-10-CM

## 2023-01-05 DIAGNOSIS — Z1151 Encounter for screening for human papillomavirus (HPV): Secondary | ICD-10-CM

## 2023-01-05 DIAGNOSIS — N930 Postcoital and contact bleeding: Secondary | ICD-10-CM

## 2023-01-05 DIAGNOSIS — Z01419 Encounter for gynecological examination (general) (routine) without abnormal findings: Secondary | ICD-10-CM | POA: Diagnosis not present

## 2023-01-05 DIAGNOSIS — Z113 Encounter for screening for infections with a predominantly sexual mode of transmission: Secondary | ICD-10-CM

## 2023-01-05 DIAGNOSIS — Z1231 Encounter for screening mammogram for malignant neoplasm of breast: Secondary | ICD-10-CM

## 2023-01-05 DIAGNOSIS — N390 Urinary tract infection, site not specified: Secondary | ICD-10-CM

## 2023-01-05 MED ORDER — DOXYCYCLINE HYCLATE 100 MG PO CAPS: 100.0000 mg | ORAL_CAPSULE | Freq: Two times a day (BID) | ORAL | 0 refills | Status: AC

## 2023-01-05 MED ORDER — TRIMETHOPRIM 100 MG PO TABS: 100.0000 mg | ORAL_TABLET | Freq: Every day | ORAL | 3 refills | Status: AC

## 2023-01-05 NOTE — Patient Instructions (Signed)
I value your feedback and you entrusting us with your care. If you get a [facility] patient survey, I would appreciate you taking the time to let us know about your experience today. Thank you!  [facility]: [phone]  [facility]: [phone]    

## 2023-01-10 LAB — IGP,CTNGTV,APT HPV,RFX16/18,45
Chlamydia, Nuc. Acid Amp: NEGATIVE
Gonococcus, Nuc. Acid Amp: NEGATIVE
HPV Aptima: NEGATIVE
Trich vag by NAA: NEGATIVE

## 2023-02-06 ENCOUNTER — Other Ambulatory Visit: Payer: Self-pay

## 2023-02-06 DIAGNOSIS — H538 Other visual disturbances: Secondary | ICD-10-CM | POA: Insufficient documentation

## 2023-02-06 DIAGNOSIS — Z5321 Procedure and treatment not carried out due to patient leaving prior to being seen by health care provider: Secondary | ICD-10-CM | POA: Insufficient documentation

## 2023-02-06 NOTE — ED Triage Notes (Addendum)
Patient arrives with redness in the right eye. Reports accidentally getting glue in the eye. Patient states vision is blurry and she can feel her contact moving in the eye against her eye lid.

## 2023-02-07 ENCOUNTER — Emergency Department
Admission: EM | Admit: 2023-02-07 | Discharge: 2023-02-07 | Discharge status: Against Medical Advice | Payer: BLUE CROSS/BLUE SHIELD | Attending: Emergency Medicine | Admitting: Emergency Medicine

## 2024-03-14 ENCOUNTER — Other Ambulatory Visit: Payer: Self-pay | Admitting: Obstetrics and Gynecology

## 2024-03-14 DIAGNOSIS — N390 Urinary tract infection, site not specified: Secondary | ICD-10-CM
